# Patient Record
Sex: Male | Born: 1945 | Race: White | Hispanic: No | Smoking: Former smoker
Health system: Southern US, Community
[De-identification: ages and names within clinical notes are randomized; demographics above are authoritative.]

## PROBLEM LIST (undated history)

## (undated) DIAGNOSIS — F1010 Alcohol abuse, uncomplicated: Secondary | ICD-10-CM

## (undated) DIAGNOSIS — Z720 Tobacco use: Secondary | ICD-10-CM

---

## 1997-08-28 ENCOUNTER — Inpatient Hospital Stay (HOSPITAL_COMMUNITY): Admission: RE | Admit: 1997-08-28 | Discharge: 1997-08-29 | Payer: Self-pay | Admitting: Neurosurgery

## 2009-06-07 ENCOUNTER — Emergency Department: Payer: Self-pay | Admitting: Emergency Medicine

## 2017-02-05 DIAGNOSIS — H269 Unspecified cataract: Secondary | ICD-10-CM | POA: Diagnosis not present

## 2017-02-05 DIAGNOSIS — J449 Chronic obstructive pulmonary disease, unspecified: Secondary | ICD-10-CM | POA: Diagnosis not present

## 2017-04-01 DIAGNOSIS — H524 Presbyopia: Secondary | ICD-10-CM | POA: Diagnosis not present

## 2017-04-01 DIAGNOSIS — Z0100 Encounter for examination of eyes and vision without abnormal findings: Secondary | ICD-10-CM | POA: Diagnosis not present

## 2018-05-05 ENCOUNTER — Emergency Department
Admission: EM | Admit: 2018-05-05 | Discharge: 2018-05-05 | Disposition: A | Discharge status: Home / Self Care | Payer: Medicare Other | Attending: Emergency Medicine | Admitting: Emergency Medicine

## 2018-05-05 ENCOUNTER — Other Ambulatory Visit: Payer: Self-pay

## 2018-05-05 ENCOUNTER — Encounter: Payer: Self-pay | Admitting: *Deleted

## 2018-05-05 ENCOUNTER — Emergency Department: Payer: Medicare Other

## 2018-05-05 DIAGNOSIS — M25552 Pain in left hip: Secondary | ICD-10-CM | POA: Diagnosis present

## 2018-05-05 DIAGNOSIS — M25559 Pain in unspecified hip: Secondary | ICD-10-CM

## 2018-05-05 DIAGNOSIS — F17200 Nicotine dependence, unspecified, uncomplicated: Secondary | ICD-10-CM | POA: Diagnosis not present

## 2018-05-05 DIAGNOSIS — M5432 Sciatica, left side: Secondary | ICD-10-CM | POA: Insufficient documentation

## 2018-05-05 MED ORDER — PREDNISONE 10 MG PO TABS: 10.0000 mg | ORAL_TABLET | Freq: Every day | ORAL | 0 refills | Status: DC

## 2018-05-05 MED ORDER — TRAMADOL HCL 50 MG PO TABS: 50.0000 mg | ORAL_TABLET | Freq: Four times a day (QID) | ORAL | 0 refills | Status: DC | PRN

## 2018-05-05 MED ORDER — HYDROCODONE-ACETAMINOPHEN 5-325 MG PO TABS: 1.0000 | ORAL_TABLET | ORAL | 0 refills | Status: DC | PRN

## 2018-05-05 MED ORDER — PREDNISONE 20 MG PO TABS
60.0000 mg | ORAL_TABLET | Freq: Once | ORAL | Status: AC
  Administered 2018-05-05: 60 mg via ORAL
  Filled 2018-05-05: qty 3

## 2018-05-05 NOTE — ED Provider Notes (Signed)
[facility] Emergency Department Provider Note  Time seen: 6:42 PM  I have reviewed the triage vital signs and the nursing notes.   HISTORY  Chief Complaint Hip Pain   HPI Peter Galvan is a 73 y.o. male presents to the emergency department for left hip pain.  According to the patient over the past 3 to 4 days he has had progressive worsening of pain in the left hip.  States while walking he will feel pain in the left hip and left lower back, states occasionally pain shooting down the leg and occasional numbness sensation in the leg.  Patient states a history of chronic back pain, states he has had pain shooting down his right leg previously with a bulging disc but never his left leg.  Denies any falls or trauma.  No fever.    No past medical history on file.  There are no active problems to display for this patient.    Prior to Admission medications   Not on File    Allergies  Allergen Reactions  . Penicillins     No family history on file.  Social History Social History   Tobacco Use  . Smoking status: Current Every Day Smoker  . Smokeless tobacco: Never Used  Substance Use Topics  . Alcohol use: Not Currently  . Drug use: Not Currently    Review of Systems Constitutional: Negative for fever. Cardiovascular: Negative for chest pain. Respiratory: Negative for shortness of breath. Gastrointestinal: Negative for abdominal pain Musculoskeletal: Left lower back pain/left hip pain occasional shooting pain down the leg. Skin: Negative for skin complaints  Neurological: Negative for headache All other ROS negative  ____________________________________________   PHYSICAL EXAM:  VITAL SIGNS: ED Triage Vitals [05/05/18 1752]  Enc Vitals Group     BP (!) 153/83     Pulse Rate 100     Resp 20     Temp 98 F (36.7 C)     Temp Source Oral     SpO2 98 %     Weight 140 lb (63.5 kg)     Height 5\' 9"  (1.753 m)     Head Circumference       Peak Flow      Pain Score 4     Pain Loc      Pain Edu?      Excl. in GC?     Constitutional: Alert and oriented. Well appearing and in no distress. Eyes: Normal exam ENT   Head: Normocephalic and atraumatic   Mouth/Throat: Mucous membranes are moist. Cardiovascular: Normal rate, regular rhythm.  Respiratory: Normal respiratory effort without tachypnea nor retractions. Breath sounds are clear  Gastrointestinal: Soft and nontender. No distention.   Musculoskeletal: No significant left hip tenderness palpation, no midline lower back tenderness, no SI joint tenderness. Neurologic:  Normal speech and language. No gross focal neurologic deficits  Skin:  Skin is warm, dry and intact.  Psychiatric: Mood and affect are normal.   ____________________________________________   RADIOLOGY  X-rays show negative hip x-ray. Degenerative changes of the lumbar spine mild retrolisthesis at L2/L3, and L3/L4.  ____________________________________________   INITIAL IMPRESSION / ASSESSMENT AND PLAN / ED COURSE  Pertinent labs & imaging results that were available during my care of the patient were reviewed by me and considered in my medical decision making (see chart for details).  Patient presents emergency department for left lower back/left hip discomfort.  Describes a shooting pain down the left leg when he walks  longer distances and occasionally feels like the left leg has become heavy or numb.  Patient denies any decrease in sensation currently, has good sensation, no incontinence issues.  Has good range of motion and good strength currently.  Highly suspect sciatica.  We will obtain x-rays as a precaution.  We will likely discharge on steroids and pain medication.  Patient's x-rays are largely nonrevealing, exam most consistent with sciatica.  We will discharge on a taper of steroids as well as a short course of pain medication.  Patient will follow-up with orthopedics.  I discussed  return precautions for any worsening pain, numbness of the leg/weakness of the leg or incontinence.  ____________________________________________   FINAL CLINICAL IMPRESSION(S) / ED DIAGNOSES  Left hip pain Sciatica   Minna Antis, MD 05/05/18 1934

## 2018-05-05 NOTE — ED Notes (Signed)
Pt c/o pain to L hip socket x 3 days. Pt also c/o lower back pain x 3 days as well. Pt states "when I walk my left leg goes numb". Pt states in the morning he wakes up with lower back pain, has to walk 10-15 mins before he can "straighten up" then sit for 10 mins before the pain subsides. Pt with cap refill < 3 seconds, +movement, +sensation. Pt states he believes that he has a bulging disk to his lower back at this time.

## 2018-05-05 NOTE — ED Notes (Signed)
Reviewed discharge instructions, follow-up care, and prescriptions with patient. Patient verbalized understanding of all information reviewed. Patient stable, with no distress noted at this time.    

## 2018-05-05 NOTE — ED Triage Notes (Signed)
Pt ambulatory to triage.  Pt has left hip pain.  No known injury pain for 3 days.  Pt also reports lower back pain.  Pt alert  Speech clear

## 2018-05-18 ENCOUNTER — Observation Stay
Admission: EM | Admit: 2018-05-18 | Discharge: 2018-05-19 | Disposition: A | Discharge status: Home / Self Care | Payer: Medicare Other | Attending: Internal Medicine | Admitting: Internal Medicine

## 2018-05-18 ENCOUNTER — Observation Stay (HOSPITAL_BASED_OUTPATIENT_CLINIC_OR_DEPARTMENT_OTHER)
Admit: 2018-05-18 | Discharge: 2018-05-18 | Disposition: A | Discharge status: Home / Self Care | Payer: Medicare Other | Attending: Internal Medicine | Admitting: Internal Medicine

## 2018-05-18 ENCOUNTER — Emergency Department: Payer: Medicare Other

## 2018-05-18 ENCOUNTER — Other Ambulatory Visit: Payer: Self-pay

## 2018-05-18 DIAGNOSIS — R079 Chest pain, unspecified: Secondary | ICD-10-CM

## 2018-05-18 DIAGNOSIS — Z716 Tobacco abuse counseling: Secondary | ICD-10-CM | POA: Diagnosis not present

## 2018-05-18 DIAGNOSIS — F17210 Nicotine dependence, cigarettes, uncomplicated: Secondary | ICD-10-CM | POA: Insufficient documentation

## 2018-05-18 DIAGNOSIS — Z885 Allergy status to narcotic agent status: Secondary | ICD-10-CM | POA: Insufficient documentation

## 2018-05-18 DIAGNOSIS — F1010 Alcohol abuse, uncomplicated: Secondary | ICD-10-CM | POA: Diagnosis not present

## 2018-05-18 DIAGNOSIS — R072 Precordial pain: Principal | ICD-10-CM | POA: Insufficient documentation

## 2018-05-18 DIAGNOSIS — Z880 Allergy status to penicillin: Secondary | ICD-10-CM | POA: Diagnosis not present

## 2018-05-18 DIAGNOSIS — R42 Dizziness and giddiness: Secondary | ICD-10-CM | POA: Insufficient documentation

## 2018-05-18 DIAGNOSIS — R739 Hyperglycemia, unspecified: Secondary | ICD-10-CM | POA: Insufficient documentation

## 2018-05-18 DIAGNOSIS — F1210 Cannabis abuse, uncomplicated: Secondary | ICD-10-CM | POA: Insufficient documentation

## 2018-05-18 DIAGNOSIS — D72829 Elevated white blood cell count, unspecified: Secondary | ICD-10-CM | POA: Insufficient documentation

## 2018-05-18 DIAGNOSIS — F17200 Nicotine dependence, unspecified, uncomplicated: Secondary | ICD-10-CM | POA: Diagnosis not present

## 2018-05-18 DIAGNOSIS — E785 Hyperlipidemia, unspecified: Secondary | ICD-10-CM | POA: Diagnosis not present

## 2018-05-18 HISTORY — DX: Tobacco use: Z72.0

## 2018-05-18 HISTORY — DX: Alcohol abuse, uncomplicated: F10.10

## 2018-05-18 LAB — TROPONIN I
Troponin I: <0.03 ng/mL (ref <0.03)
Troponin I: <0.03 ng/mL (ref <0.03)
Troponin I: <0.03 ng/mL (ref <0.03)
Troponin I: <0.03 ng/mL (ref <0.03)

## 2018-05-18 LAB — CBC WITH DIFFERENTIAL/PLATELET
Abs Immature Granulocytes: 0.19 10*3/uL — ABNORMAL HIGH (ref 0.00–0.07)
Basophils Absolute: 0.1 10*3/uL (ref 0.0–0.1)
Basophils Relative: 0 %
Eosinophils Absolute: 0.1 10*3/uL (ref 0.0–0.5)
Eosinophils Relative: 1 %
HCT: 43.2 % (ref 39.0–52.0)
Hemoglobin: 14.5 g/dL (ref 13.0–17.0)
Immature Granulocytes: 1 %
Lymphocytes Relative: 14 %
Lymphs Abs: 2.5 10*3/uL (ref 0.7–4.0)
MCH: 31.6 pg (ref 26.0–34.0)
MCHC: 33.6 g/dL (ref 30.0–36.0)
MCV: 94.1 fL (ref 80.0–100.0)
Monocytes Absolute: 1.2 10*3/uL — ABNORMAL HIGH (ref 0.1–1.0)
Monocytes Relative: 6 %
Neutro Abs: 14 10*3/uL — ABNORMAL HIGH (ref 1.7–7.7)
Neutrophils Relative %: 78 %
Platelets: 222 10*3/uL (ref 150–400)
RBC: 4.59 MIL/uL (ref 4.22–5.81)
RDW: 13.9 % (ref 11.5–15.5)
WBC: 18 10*3/uL — ABNORMAL HIGH (ref 4.0–10.5)
nRBC: 0 % (ref 0.0–0.2)

## 2018-05-18 LAB — URINALYSIS, COMPLETE (UACMP) WITH MICROSCOPIC
Bilirubin Urine: NEGATIVE
Glucose, UA: NEGATIVE mg/dL
Hgb urine dipstick: NEGATIVE
Ketones, ur: NEGATIVE mg/dL
Leukocytes,Ua: NEGATIVE
Nitrite: NEGATIVE
Protein, ur: NEGATIVE mg/dL
Specific Gravity, Urine: 1.013 (ref 1.005–1.030)
Squamous Epithelial / LPF: NONE SEEN (ref 0–5)
pH: 7 (ref 5.0–8.0)

## 2018-05-18 LAB — BASIC METABOLIC PANEL
Anion gap: 8 (ref 5–15)
BUN: 30 mg/dL — ABNORMAL HIGH (ref 8–23)
CO2: 26 mmol/L (ref 22–32)
Calcium: 8.9 mg/dL (ref 8.9–10.3)
Chloride: 102 mmol/L (ref 98–111)
Creatinine, Ser: 1.21 mg/dL (ref 0.61–1.24)
GFR calc Af Amer: >60 mL/min (ref >60)
GFR calc non Af Amer: >60 mL/min (ref >60)
Glucose, Bld: 111 mg/dL — ABNORMAL HIGH (ref 70–99)
Potassium: 4.2 mmol/L (ref 3.5–5.1)
Sodium: 136 mmol/L (ref 135–145)

## 2018-05-18 LAB — CBC
HCT: 43 % (ref 39.0–52.0)
Hemoglobin: 14.3 g/dL (ref 13.0–17.0)
MCH: 31 pg (ref 26.0–34.0)
MCHC: 33.3 g/dL (ref 30.0–36.0)
MCV: 93.3 fL (ref 80.0–100.0)
Platelets: 254 10*3/uL (ref 150–400)
RBC: 4.61 MIL/uL (ref 4.22–5.81)
RDW: 14 % (ref 11.5–15.5)
WBC: 18.4 10*3/uL — ABNORMAL HIGH (ref 4.0–10.5)
nRBC: 0 % (ref 0.0–0.2)

## 2018-05-18 LAB — ECHOCARDIOGRAM COMPLETE
Height: 69 in
Weight: 2222.24 oz

## 2018-05-18 LAB — PROCALCITONIN: Procalcitonin: <0.1 ng/mL

## 2018-05-18 LAB — HEPATIC FUNCTION PANEL
ALT: 24 U/L (ref 0–44)
AST: 16 U/L (ref 15–41)
Albumin: 3.4 g/dL — ABNORMAL LOW (ref 3.5–5.0)
Alkaline Phosphatase: 63 U/L (ref 38–126)
Bilirubin, Direct: <0.1 mg/dL (ref 0.0–0.2)
Total Bilirubin: 0.5 mg/dL (ref 0.3–1.2)
Total Protein: 6.7 g/dL (ref 6.5–8.1)

## 2018-05-18 LAB — MAGNESIUM: Magnesium: 2.2 mg/dL (ref 1.7–2.4)

## 2018-05-18 MED ORDER — ATORVASTATIN CALCIUM 20 MG PO TABS
40.0000 mg | ORAL_TABLET | Freq: Every day | ORAL | Status: DC
  Administered 2018-05-18: 40 mg via ORAL
  Filled 2018-05-18: qty 2

## 2018-05-18 MED ORDER — NICOTINE 21 MG/24HR TD PT24
21.0000 mg | MEDICATED_PATCH | Freq: Every day | TRANSDERMAL | Status: DC | PRN
  Administered 2018-05-18: 21 mg via TRANSDERMAL
  Filled 2018-05-18 (×2): qty 1

## 2018-05-18 MED ORDER — ALUM & MAG HYDROXIDE-SIMETH 200-200-20 MG/5ML PO SUSP: 30.0000 mL | Freq: Four times a day (QID) | ORAL | Status: DC | PRN

## 2018-05-18 MED ORDER — ONDANSETRON HCL 4 MG/2ML IJ SOLN: 4.0000 mg | Freq: Four times a day (QID) | INTRAMUSCULAR | Status: DC | PRN

## 2018-05-18 MED ORDER — LIDOCAINE VISCOUS HCL 2 % MT SOLN
15.0000 mL | Freq: Four times a day (QID) | OROMUCOSAL | Status: DC | PRN
  Filled 2018-05-18: qty 15

## 2018-05-18 MED ORDER — MORPHINE SULFATE (PF) 2 MG/ML IV SOLN: 2.0000 mg | INTRAVENOUS | Status: DC | PRN

## 2018-05-18 MED ORDER — NITROGLYCERIN 2 % TD OINT
1.0000 [in_us] | TOPICAL_OINTMENT | Freq: Once | TRANSDERMAL | Status: AC
  Administered 2018-05-18: 13:00:00 1 [in_us] via TOPICAL
  Filled 2018-05-18: qty 1

## 2018-05-18 MED ORDER — ENOXAPARIN SODIUM 40 MG/0.4ML ~~LOC~~ SOLN
40.0000 mg | SUBCUTANEOUS | Status: DC
  Administered 2018-05-18: 40 mg via SUBCUTANEOUS
  Filled 2018-05-18: qty 0.4

## 2018-05-18 MED ORDER — ASPIRIN EC 81 MG PO TBEC
81.0000 mg | DELAYED_RELEASE_TABLET | Freq: Every day | ORAL | Status: DC
  Administered 2018-05-19: 81 mg via ORAL
  Filled 2018-05-18: qty 1

## 2018-05-18 MED ORDER — ACETAMINOPHEN 325 MG PO TABS: 650.0000 mg | ORAL_TABLET | ORAL | Status: DC | PRN

## 2018-05-18 MED ORDER — METOPROLOL TARTRATE 25 MG PO TABS
25.0000 mg | ORAL_TABLET | Freq: Two times a day (BID) | ORAL | Status: DC
  Administered 2018-05-18 – 2018-05-19 (×2): 25 mg via ORAL
  Filled 2018-05-18 (×2): qty 1

## 2018-05-18 MED ORDER — ASPIRIN 325 MG PO TABS
325.0000 mg | ORAL_TABLET | Freq: Once | ORAL | Status: DC
  Filled 2018-05-18: qty 1

## 2018-05-18 MED ORDER — SODIUM CHLORIDE 0.9% FLUSH
10.0000 mL | Freq: Two times a day (BID) | INTRAVENOUS | Status: DC
  Administered 2018-05-18: 10 mL via INTRAVENOUS

## 2018-05-18 NOTE — Progress Notes (Signed)
*  PRELIMINARY RESULTS* Echocardiogram 2D Echocardiogram has been performed.  Joanette Gula England Greb 05/18/2018, 4:32 PM

## 2018-05-18 NOTE — Consult Note (Signed)
Cardiology Consultation:   Patient ID: DEQUAVIUS KUHNER; [id number]; October 19, 1945   Admit date: 05/18/2018 Date of Consult: 05/18/2018  Primary Care Provider: Patient, No Pcp Per Primary Cardiologist: New to [facility] - consult by Gollan   Patient Profile:   Peter Galvan is a 73 y.o. 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 Nancy Marus.  History of Present Illness:   Peter Galvan has not seen a primary care physician for a routine checkup in at least 20 years.  He states "my PCP died 15 years ago."  Patient lives alone following the death of his son 3 years prior.  He indicates his son was born with 50 heart defects and underwent heart transplant 15 years prior at [facility]  Since then, the patient mostly enjoys fishing on a weekly basis with his brother.  Patient was in his usual state of health sitting on his swing in his backyard this morning when he developed sudden onset of substernal chest pressure rated a 7-8 out of 10 and lasted for approximately 3 to 4 minutes.  Patient stood up to walk inside and developed dizziness/presyncope and diaphoresis.  He made it to his kitchen table and called his neighbor who then contacted EMS.  All together, the patient indicates his symptoms lasted for a total of 10 to 15 minutes followed by spontaneous resolution.  Since then, he has been symptom-free.  He has denied any symptoms similar to this in the past.  He indicates at baseline his functional status is somewhat low and has attributed this to pain along his left hip.  More recently, the patient was seen in the [facility] ED on 05/05/2018 for left hip pain with associated sciatica and prescribed prednisone which has helped his symptoms.  He continues to smoke 1 pack daily and has done so for approximately 50 years.  He also  indicates smoking the occasional joint though denies any other illegal drugs.  He has a prior history of significant alcohol abuse though has not drank in 15 years.  He denies any lower extremity swelling, orthopnea, PND, abdominal distention, or early satiety.  Upon the patient's arrival to [facility] they were found to have stable vitals and be afebrile. EKG showed NSR, 70 bpm, normal axis, inferolateral Q waves, LVH with early repolarization abnormality, CXR showed no active cardiopulmonary process. Labs showed troponin negative x1, AST/ALT normal, CBC 18.4, Hgb 14.3, PLT 254, potassium 4.2, serum creatinine 1.21.  In the ED, aspirin 324 has been ordered and the patient has received sublingual nitroglycerin.  Upon admission, cardiology was consulted.  Currently, symptom-free and wants to go bass fishing.  Past Medical History:  Diagnosis Date   Alcohol abuse    a. reported seizure-like activity    Tobacco abuse     History reviewed. No pertinent surgical history.   Home Meds: Prior to Admission medications   Not on File    Inpatient Medications: Scheduled Meds:  [START ON 05/19/2018] aspirin EC  81 mg Oral Daily   atorvastatin  40 mg Oral q1800   enoxaparin (LOVENOX) injection  40 mg Subcutaneous Q24H   metoprolol tartrate  25 mg Oral BID   Continuous Infusions:  PRN Meds:   Allergies:   Allergies  Allergen Reactions   Codeine Itching and Nausea And Vomiting  Penicillins Hives    Did it involve swelling of the face/tongue/throat, SOB, or low BP? No Did it involve sudden or severe rash/hives, skin peeling, or any reaction on the inside of your mouth or nose? No Did you need to seek medical attention at a hospital or doctor's office? No When did it last happen?childhood If all above answers are NO, may proceed with cephalosporin use.    Social History:   Social History   Socioeconomic History   Marital status: Married    Spouse name: Not on file   Number  of children: Not on file   Years of education: Not on file   Highest education level: Not on file  Occupational History   Not on file  Social Needs   Financial resource strain: Not on file   Food insecurity:    Worry: Not on file    Inability: Not on file   Transportation needs:    Medical: Not on file    Non-medical: Not on file  Tobacco Use   Smoking status: Current Every Day Smoker    Packs/day: 1.00    Years: 50.00    Pack years: 50.00    Types: Cigarettes   Smokeless tobacco: Never Used  Substance and Sexual Activity   Alcohol use: Not Currently    Comment: a.  Previously reports being a heavy drinker though has not drank in 15 years   Drug use: Yes    Types: Marijuana   Sexual activity: Not on file  Lifestyle   Physical activity:    Days per week: Not on file    Minutes per session: Not on file   Stress: Not on file  Relationships   Social connections:    Talks on phone: Not on file    Gets together: Not on file    Attends religious service: Not on file    Active member of club or organization: Not on file    Attends meetings of clubs or organizations: Not on file    Relationship status: Not on file   Intimate partner violence:    Fear of current or ex partner: Not on file    Emotionally abused: Not on file    Physically abused: Not on file    Forced sexual activity: Not on file  Other Topics Concern   Not on file  Social History Narrative   Not on file     Family History:   Family History  Problem Relation Age of Onset   Hypertension Father    Heart disease Son        a. s/p heart transplant     ROS:  Review of Systems  Constitutional: Positive for diaphoresis and malaise/fatigue. Negative for chills, fever and weight loss.  HENT: Negative for congestion.   Eyes: Negative for discharge and redness.  Respiratory: Positive for shortness of breath. Negative for cough, hemoptysis, sputum production and wheezing.   Cardiovascular:  Positive for chest pain. Negative for palpitations, orthopnea, claudication, leg swelling and PND.  Gastrointestinal: Negative for abdominal pain, blood in stool, heartburn, melena, nausea and vomiting.  Genitourinary: Negative for hematuria.  Musculoskeletal: Negative for falls and myalgias.  Skin: Negative for rash.  Neurological: Positive for dizziness and weakness. Negative for tingling, tremors, sensory change, speech change, focal weakness and loss of consciousness.  Endo/Heme/Allergies: Does not bruise/bleed easily.  Psychiatric/Behavioral: Negative for substance abuse. The patient is not nervous/anxious.   All other systems reviewed and are negative.  Physical Exam/Data:   Vitals:   05/18/18 1330 05/18/18 1345 05/18/18 1400 05/18/18 1446  BP: 126/70 132/71 133/76 (!) 158/79  Pulse: 74 73 73 84  Resp: Temp:    98.6 F (37 C)  TempSrc:    Oral  SpO2: 100% 100% 100% 100%  Weight:      Height:       No intake or output data in the 24 hours ending 05/18/18 1524 Filed Weights   05/18/18 1232  Weight: 63 kg   Body mass index is 20.51 kg/m.   Physical Exam: General: Well developed, well nourished, in no acute distress. Head: Normocephalic, atraumatic, sclera non-icteric, no xanthomas, nares without discharge.  Neck: Negative for carotid bruits. JVD not elevated. Lungs: Clear bilaterally to auscultation without wheezes, rales, or rhonchi. Breathing is unlabored. Heart: RRR with S1 S2. No murmurs, rubs, or gallops appreciated. Abdomen: Soft, non-tender, non-distended with normoactive bowel sounds. No hepatomegaly. No rebound/guarding. No obvious abdominal masses. Msk:  Strength and tone appear normal for age. Extremities: No clubbing or cyanosis. No edema. Distal pedal pulses are 2+ and equal bilaterally. Neuro: Alert and oriented X 3. No facial asymmetry. No focal deficit. Moves all extremities spontaneously. Psych:  Responds to questions appropriately with a  normal affect.   EKG:  The EKG was personally reviewed and demonstrates: NSR, 70 bpm, normal axis, inferolateral Q waves, LVH with early repolarization abnormality Telemetry:  Telemetry was personally reviewed and demonstrates: Not available for review at time of consult  Weights: [facility] Weights   05/18/18 1232  Weight: 63 kg    Relevant CV Studies: None   Laboratory Data:  Chemistry Recent Labs  Lab 05/18/18 1241  NA 136  K 4.2  CL 102  CO2 26  GLUCOSE 111*  BUN 30*  CREATININE 1.21  CALCIUM 8.9  GFRNONAA >60  GFRAA >60  ANIONGAP 8    Recent Labs  Lab 05/18/18 1241  PROT 6.7  ALBUMIN 3.4*  AST 16  ALT 24  ALKPHOS 63  BILITOT 0.5   Hematology Recent Labs  Lab 05/18/18 1241  WBC 18.4*  RBC 4.61  HGB 14.3  HCT 43.0  MCV 93.3  MCH 31.0  MCHC 33.3  RDW 14.0  PLT 254   Cardiac Enzymes Recent Labs  Lab 05/18/18 1241  TROPONINI <0.03   No results for input(s): TROPIPOC in the last 168 hours.  BNPNo results for input(s): BNP, PROBNP in the last 168 hours.  DDimer No results for input(s): DDIMER in the last 168 hours.  Radiology/Studies:  Dg Chest 2 View  Result Date: 05/18/2018 IMPRESSION: No active cardiopulmonary disease. Electronically Signed   By: Lupita Raider M.D.   On: 05/18/2018 13:16    Assessment and Plan:   1.  Chest pain with moderate risk for cardiac etiology: -Presentation of symptoms at rest is atypical.  However, the patient does have significant risk factors for CAD including ongoing tobacco abuse with a 50-pack-year history, male sex with age greater than 39 years of age and his associated symptoms are concerning -He has not been evaluated by a PCP in at least 20 years -Currently, symptom-free -Initial troponin negative, continue to cycle for rule out -Echo pending -ASA -Check fasting lipid panel and A1c for further risk stratification -If he rules out, would hold off on starting heparin drip and plan for Lexiscan Myoview on  4/23 to evaluate for high risk ischemia -If patient demonstrates significant rise in troponin or has  symptoms concerning for unstable angina he would benefit from initiation of heparin infusion with consideration for urgent cardiac cath  2.  Hyperglycemia: -A1c pending  3.  Ongoing tobacco abuse: -Nicotine patch -Complete cessation is advised  4.  Leukocytosis: -No obvious source of infection -Possibly inflammatory in the setting of #1 -Trend  5.  Upper extremity cramping: -Potassium at goal -Check magnesium   For questions or updates, please contact [facility] Please consult [email or web address] for contact info under Cardiology/STEMI.   Signed, Eula Listenyan Camera Krienke, PA-C [facility] Pager: [phone] 05/18/2018, 3:24 PM

## 2018-05-18 NOTE — ED Triage Notes (Signed)
Pt comes from home via EMS after having some tightness in the center of his chest for about 5 mins with some SOB. EKG normal per EMS. VSS. Pt reports CP has subsided since.

## 2018-05-18 NOTE — ED Notes (Signed)
ED TO INPATIENT HANDOFF REPORT  ED Nurse Name and Phone #: Shanda Bumps [phone]  S Name/Age/Gender Peter Galvan 73 y.o. male Room/Bed: ED14A/ED14A  Code Status   Code Status: Not on file  Home/SNF/Other Home Patient oriented to: self, place, time and situation Is this baseline? Yes   Triage Complete: Triage complete  Chief Complaint cp  Triage Note Pt comes from home via EMS after having some tightness in the center of his chest for about 5 mins with some SOB. EKG normal per EMS. VSS. Pt reports CP has subsided since.    Allergies Allergies  Allergen Reactions  . Codeine Itching and Nausea And Vomiting  . Penicillins Hives    Did it involve swelling of the face/tongue/throat, SOB, or low BP? No Did it involve sudden or severe rash/hives, skin peeling, or any reaction on the inside of your mouth or nose? No Did you need to seek medical attention at a hospital or doctor's office? No When did it last happen?childhood If all above answers are "NO", may proceed with cephalosporin use.    Level of Care/Admitting Diagnosis ED Disposition    ED Disposition Condition Comment   Admit  Hospital Area: [facility] [[id number]]  Level of Care: Telemetry [5]  Covid Evaluation: N/A  Diagnosis: Chest pain [299371]  Admitting Physician: Willadean Carol DODD [[id number]]  Attending Physician: Willadean Carol DODD [[id number]]  PT Class (Do Not Modify): Observation [104]  PT Acc Code (Do Not Modify): Observation [10022]       B Medical/Surgery History History reviewed. No pertinent past medical history. History reviewed. No pertinent surgical history.   A IV Location/Drains/Wounds Patient Lines/Drains/Airways Status   Active Line/Drains/Airways    Name:   Placement date:   Placement time:   Site:   Days:   Peripheral IV 05/18/18 Right Antecubital   05/18/18    1240    Antecubital   less than 1          Intake/Output Last 24 hours No intake or output data in the  24 hours ending 05/18/18 1419  Labs/Imaging Results for orders placed or performed during the hospital encounter of 05/18/18 (from the past 48 hour(s))  Basic metabolic panel     Status: Abnormal   Collection Time: 05/18/18 12:41 PM  Result Value Ref Range   Sodium 136 135 - 145 mmol/L   Potassium 4.2 3.5 - 5.1 mmol/L   Chloride 102 98 - 111 mmol/L   CO2 26 22 - 32 mmol/L   Glucose, Bld 111 (H) 70 - 99 mg/dL   BUN 30 (H) 8 - 23 mg/dL   Creatinine, Ser 0.25 0.61 - 1.24 mg/dL   Calcium 8.9 8.9 - 85.2 mg/dL   GFR calc non Af Amer >60 >60 mL/min   GFR calc Af Amer >60 >60 mL/min   Anion gap 8 5 - 15    Comment: Performed at [facility], [address] [place], [place] [address]  CBC     Status: Abnormal   Collection Time: 05/18/18 12:41 PM  Result Value Ref Range   WBC 18.4 (H) 4.0 - 10.5 K/uL   RBC 4.61 4.22 - 5.81 MIL/uL   Hemoglobin 14.3 13.0 - 17.0 g/dL   HCT 23.5 36.1 - 44.3 %   MCV 93.3 80.0 - 100.0 fL   MCH 31.0 26.0 - 34.0 pg   MCHC 33.3 30.0 - 36.0 g/dL   RDW 15.4 00.8 - 67.6 %   Platelets 254 150 -  400 K/uL   nRBC 0.0 0.0 - 0.2 %    Comment: Performed at [facility], [address] [place], [place] [address]  Troponin I - ONCE - STAT     Status: None   Collection Time: 05/18/18 12:41 PM  Result Value Ref Range   Troponin I <0.03 <0.03 ng/mL    Comment: Performed at [facility], [address] [place], [place] [address]  Hepatic function panel     Status: Abnormal   Collection Time: 05/18/18 12:41 PM  Result Value Ref Range   Total Protein 6.7 6.5 - 8.1 g/dL   Albumin 3.4 (L) 3.5 - 5.0 g/dL   AST 16 15 - 41 U/L   ALT 24 0 - 44 U/L   Alkaline Phosphatase 63 38 - 126 U/L   Total Bilirubin 0.5 0.3 - 1.2 mg/dL   Bilirubin, Direct <1.9<0.1 0.0 - 0.2 mg/dL   Indirect Bilirubin NOT CALCULATED 0.3 - 0.9 mg/dL    Comment: Performed at [facility], [address] [place], [place] [address]   Dg Chest 2 View  Result Date:  05/18/2018 CLINICAL DATA:  Chest pain. EXAM: CHEST - 2 VIEW COMPARISON:  None. FINDINGS: The heart size and mediastinal contours are within normal limits. Both lungs are clear. No pneumothorax or pleural effusion is noted. The visualized skeletal structures are unremarkable. IMPRESSION: No active cardiopulmonary disease. Electronically Signed   By: Lupita RaiderJames  Green Jr M.D.   On: 05/18/2018 13:16    Pending Labs [employer] Labs (From admission, onward)    Start     Ordered   Signed and Held  HIV antibody (Routine Testing)  Once,   R     Signed and Held   Signed and Held  Troponin I - Now Then Q3H  Now then every 3 hours,   TIMED     Signed and Held   Signed and Held  Procalcitonin - Baseline  ONCE - STAT,   STAT     Signed and Held   Signed and Held  Urinalysis, Complete w Microscopic  Once,   R     Signed and Held   Signed and Held  Hemoglobin A1c  Tomorrow morning,   R     Signed and Held   Signed and Held  Lipid panel  Tomorrow morning,   R     Signed and Held          Vitals/Pain Today's Vitals   05/18/18 1316 05/18/18 1330 05/18/18 1345 05/18/18 1400  BP: 130/78 126/70 132/71 133/76  Pulse: 79 74 73 73  Resp: 16 20 12 10   Temp:      TempSrc:      SpO2: 100% 100% 100% 100%  Weight:      Height:      PainSc:        Isolation Precautions No active isolations  Medications Medications  aspirin tablet 325 mg (has no administration in time range)  nitroGLYCERIN (NITROGLYN) 2 % ointment 1 inch (1 inch Topical Given 05/18/18 1326)    Mobility walks Low fall risk   Focused Assessments Cardiac Assessment Handoff:  Cardiac Rhythm: Normal sinus rhythm Lab Results  Component Value Date   TROPONINI <0.03 05/18/2018   No results found for: DDIMER Does the Patient currently have chest pain? No     R Recommendations: See Admitting Provider Note  Report given to:   Additional Notes: 1in nitro paste on pt

## 2018-05-18 NOTE — ED Provider Notes (Signed)
[facility] Emergency Department Provider Note  ____________________________________________   First MD Initiated Contact with Patient 05/18/18 1239     (approximate)  I have reviewed the triage vital signs and the nursing notes.   HISTORY  Chief Complaint Chest Pain    HPI Peter Galvan is a 73 y.o. male presents emergency department complaining of midsternal chest pain that lasted for about 5 minutes prior to arrival.  He states he was sitting on a swing outside felt a chest pain once it passed he walked to the house and became diaphoretic.  He had no nausea or vomiting.  No pain at this time.  States the pain did not radiate anywhere.  Denies abdominal pain.  Denies shortness of breath.  No prior history of heart problems.  Patient is a smoker.  1 pack/day for over 50 years.    History reviewed. No pertinent past medical history.  There are no active problems to display for this patient.   History reviewed. No pertinent surgical history.  Prior to Admission medications   Not on File    Allergies Codeine and Penicillins  History reviewed. No pertinent family history.  Social History Social History   Tobacco Use  . Smoking status: Current Every Day Smoker  . Smokeless tobacco: Never Used  Substance Use Topics  . Alcohol use: Not Currently  . Drug use: Not Currently    Review of Systems  Constitutional: No fever/chills Eyes: No visual changes. ENT: No sore throat. Respiratory: Denies cough Cardiovascular: Positive chest pain Genitourinary: Negative for dysuria. Musculoskeletal: Negative for back pain. Skin: Negative for rash.    ____________________________________________   PHYSICAL EXAM:  VITAL SIGNS: ED Triage Vitals  Enc Vitals Group     BP 05/18/18 1234 137/82     Pulse Rate 05/18/18 1234 77     Resp 05/18/18 1234 16     Temp 05/18/18 1234 98.9 F (37.2 C)     Temp Source 05/18/18 1234 Oral     SpO2  05/18/18 1234 99 %     Weight 05/18/18 1232 138 lb 14.2 oz (63 kg)     Height 05/18/18 1232 5\' 9"  (1.753 m)     Head Circumference --      Peak Flow --      Pain Score 05/18/18 1232 0     Pain Loc --      Pain Edu? --      Excl. in GC? --     Constitutional: Alert and oriented. Well appearing and in no acute distress. Eyes: Conjunctivae are normal.  Head: Atraumatic. Nose: No congestion/rhinnorhea. Mouth/Throat: Mucous membranes are moist.   Neck:  supple no lymphadenopathy noted Cardiovascular: Normal rate, regular rhythm. Heart sounds are normal Respiratory: Normal respiratory effort.  No retractions, lungs c t a  Abd: soft nontender bs normal all 4 quad, no pulsatile masses noted GU: deferred Musculoskeletal: FROM all extremities, warm and well perfused Neurologic:  Normal speech and language.  Skin:  Skin is warm, dry and intact. No rash noted. Psychiatric: Mood and affect are normal. Speech and behavior are normal.  ____________________________________________   LABS (all labs ordered are listed, but only abnormal results are displayed)  Labs Reviewed  BASIC METABOLIC PANEL - Abnormal; Notable for the following components:      Result Value   Glucose, Bld 111 (*)    BUN 30 (*)    All other components within normal limits  CBC - Abnormal; Notable for the  following components:   WBC 18.4 (*)    All other components within normal limits  TROPONIN I  HEPATIC FUNCTION PANEL   ____________________________________________   ____________________________________________  RADIOLOGY  Chest x-ray is normal  ____________________________________________   PROCEDURES  Procedure(s) performed: EKG shows normal sinus rhythm  Procedures    ____________________________________________   INITIAL IMPRESSION / ASSESSMENT AND PLAN / ED COURSE  Pertinent labs & imaging results that were available during my care of the patient were reviewed by me and considered in my  medical decision making (see chart for details).   Patient 73 year old male presents emergency department with chest pain prior to arrival.  No chest pain at this time.  Physical exam shows that the patient appears well and talkative.  Heart sounds are normal, lungs are clear to auscultation, abdomen is nontender.  EKG shows normal sinus rhythm  Cardiac labs and chest x-ray ordered.  ddx: MI, pleuritic chest pain, pneumonia, angina, atypical chest pain    ----------------------------------------- 1:34 PM on 05/18/2018 -----------------------------------------  Dr. Mayford KnifeWilliams examined patient.  He agrees patient has nonspecific chest pain is to be admitted for observation.  Hospitalist was called and she agrees to admit patient.   As part of my medical decision making, I reviewed the following data within the electronic MEDICAL RECORD NUMBER Nursing notes reviewed and incorporated, Labs reviewed CBC shows elevated WBC of 18.4, troponin is normal, basic metabolic panel is normal, hepatic functions pending, EKG interpreted NSR, Old chart reviewed, Radiograph reviewed chest x-ray is normal, Discussed with admitting physician hospitalist, Evaluated by EM attending Dr. Mayford KnifeWilliams, Notes from prior ED visits and [place] Controlled Substance Database  ____________________________________________   FINAL CLINICAL IMPRESSION(S) / ED DIAGNOSES  Final diagnoses:  Nonspecific chest pain      NEW MEDICATIONS STARTED DURING THIS VISIT:  New Prescriptions   No medications on file     Note:  This document was prepared using Dragon voice recognition software and may include unintentional dictation errors.    Faythe GheeFisher, Laurice Kimmons W, PA-C 05/18/18 1336    Emily FilbertWilliams, Jonathan E, MD 05/18/18 [phone]

## 2018-05-18 NOTE — Progress Notes (Signed)
Family Meeting Note  Advance Directive:yes  Today a meeting took place with the Patient.  Patient is able to participate.  The following clinical team members were present during this meeting:MD  The following were discussed:Patient's diagnosis: chest pain, Patient's progosis: Unable to determine and Goals for treatment: Full Code   Discussed CODE STATUS in detail with patient.  He states he has had a lot of conversations about this in the past with his son, who had congenital heart disease and passed away 3 years ago.  Patient states that he is overall very healthy and has no medical conditions.  He states that he "still has a lot of bass fishing left to do".  Patient like to be full code at this time and pursue full aggressive medical care.  Additional follow-up to be provided: prn  Time spent during discussion:20 minutes  Hilton Sinclair, MD

## 2018-05-18 NOTE — H&P (Signed)
[facility] [place] [facility]   PATIENT NAME: Earleen NewportMichael Eyerman    MR#:  [id number]  DATE OF BIRTH:  10/12/1945  DATE OF ADMISSION:  05/18/2018  PRIMARY CARE PHYSICIAN: Patient, No Pcp Per   REQUESTING/REFERRING PHYSICIAN: Daryel NovemberJonathan Williams, MD  CHIEF COMPLAINT:   Chief Complaint  Patient presents with  . Chest Pain    HISTORY OF PRESENT ILLNESS:  Earleen NewportMichael Starrett  is a 73 y.o. male with no past medical history who presented to the ED with substernal chest pain that started this morning.  He was sitting in a swing in his backyard around 8:30 AM this morning, when he suddenly developed midsternal "chest pressure" that radiated up to his neck and to his mid back.  He had associated shortness of breath.  The pain lasted 3-4 minutes and then resolve on its own.  He then tried to walk back inside the house when he suddenly felt "lightheaded".  He then became very sweaty, so he called his neighbor, who called EMS.  He has no history of heart problems.  He does smoke 1 pack/day for 50 years.   In the ED, vitals were unremarkable.  Labs are significant for WBC 18.4.  Troponin was negative.  Chest x-ray was negative.  EKG is unremarkable.  Due to concern for unstable angina, hospitalists were called for admission.  PAST MEDICAL HISTORY:  None  PAST SURGICAL HISTORY:  None  SOCIAL HISTORY:   Social History   Tobacco Use  . Smoking status: Current Every Day Smoker  . Smokeless tobacco: Never Used  Substance Use Topics  . Alcohol use: Not Currently    FAMILY HISTORY:  Son-congenital heart disease  DRUG ALLERGIES:   Allergies  Allergen Reactions  . Codeine Itching and Nausea And Vomiting  . Penicillins Hives    Did it involve swelling of the face/tongue/throat, SOB, or low BP? No Did it involve sudden or severe rash/hives, skin peeling, or any reaction on the inside of your mouth or nose? No Did you need to seek medical attention at a hospital or  doctor's office? No When did it last happen?childhood If all above answers are "NO", may proceed with cephalosporin use.    REVIEW OF SYSTEMS:   Review of Systems  Constitutional: Negative for chills and fever.  HENT: Negative for congestion and sore throat.   Eyes: Negative for blurred vision and double vision.  Respiratory: Positive for shortness of breath. Negative for cough.   Cardiovascular: Positive for chest pain. Negative for palpitations and leg swelling.  Gastrointestinal: Negative for nausea and vomiting.  Genitourinary: Negative for dysuria and urgency.  Musculoskeletal: Negative for back pain and neck pain.  Neurological: Positive for dizziness. Negative for headaches.  Psychiatric/Behavioral: Negative for depression and suicidal ideas.    MEDICATIONS AT HOME:   Prior to Admission medications   Not on File      VITAL SIGNS:  Blood pressure 126/70, pulse 74, temperature 98.9 F (37.2 C), temperature source Oral, resp. rate 20, height 5\' 9"  (1.753 m), weight 63 kg, SpO2 100 %.  PHYSICAL EXAMINATION:  Physical Exam  GENERAL:  73 y.o.-year-old patient lying in the bed with no acute distress.  EYES: Pupils equal, round, reactive to light and accommodation. No scleral icterus. Extraocular muscles intact.  HEENT: Head atraumatic, normocephalic. Oropharynx and nasopharynx clear.  NECK:  Supple, no jugular venous distention. No thyroid enlargement, no tenderness.  LUNGS: Normal breath sounds bilaterally, no wheezing, rales,rhonchi or crepitation. No use  of accessory muscles of respiration.  CARDIOVASCULAR: RRR, S1, S2 normal. No murmurs, rubs, or gallops.  ABDOMEN: Soft, nontender, nondistended. Bowel sounds present. No organomegaly or mass.  EXTREMITIES: No pedal edema, cyanosis, or clubbing.  NEUROLOGIC: Cranial nerves II through XII are intact. Muscle strength 5/5 in all extremities. Sensation intact. Gait not checked.  PSYCHIATRIC: The patient is alert and  oriented x 3.  SKIN: No obvious rash, lesion, or ulcer.   LABORATORY PANEL:   CBC Recent Labs  Lab 05/18/18 1241  WBC 18.4*  HGB 14.3  HCT 43.0  PLT 254   ------------------------------------------------------------------------------------------------------------------  Chemistries  Recent Labs  Lab 05/18/18 1241  NA 136  K 4.2  CL 102  CO2 26  GLUCOSE 111*  BUN 30*  CREATININE 1.21  CALCIUM 8.9   ------------------------------------------------------------------------------------------------------------------  Cardiac Enzymes Recent Labs  Lab 05/18/18 1241  TROPONINI <0.03   ------------------------------------------------------------------------------------------------------------------  RADIOLOGY:  Dg Chest 2 View  Result Date: 05/18/2018 CLINICAL DATA:  Chest pain. EXAM: CHEST - 2 VIEW COMPARISON:  None. FINDINGS: The heart size and mediastinal contours are within normal limits. Both lungs are clear. No pneumothorax or pleural effusion is noted. The visualized skeletal structures are unremarkable. IMPRESSION: No active cardiopulmonary disease. Electronically Signed   By: Lupita Raider M.D.   On: 05/18/2018 13:16      IMPRESSION AND PLAN:   Chest pain- concern for cardiac etiology.  Patient has never had a cardiac work-up before.  Chest pain has resolved in the ED. BP normal, so think aortic dissection is less likely.  No tachycardia or hypoxia to suggest PE. -Cardiology consult- [facility] -Trend troponins -Give aspirin 325 mg now, then continue daily aspirin 81 mg -Start beta-blocker and statin -ECHO ordered -Check lipid panel and a1c  Leukocytosis- unclear etiology.  Patient denies any infectious symptoms.  No fevers.  Chest x-ray negative. -Check UA, procalcitonin, and differential -Repeat two-view CXR in the morning -Hold on antibiotics for now.  Hyperglycemia- no diagnosis of diabetes -Check a1c  Tobacco abuse- has smoked 1 pack/day for 50 years  -Nicotine patch as needed -Tobacco cessation counseling performed by me  All the records are reviewed and case discussed with ED provider. Management plans discussed with the patient, family and they are in agreement.  CODE STATUS: Full  TOTAL TIME TAKING CARE OF THIS PATIENT: 45 minutes.    Jinny Blossom Zhana Jeangilles M.D on 05/18/2018 at 1:44 PM  Between 7am to 6pm - Pager - [phone]  After 6pm go to [email or web address] - Social research officer, government  [facility]  Office  [phone]  CC: Primary care physician; Patient, No Pcp Per   Note: This dictation was prepared with Dragon dictation along with smaller phrase technology. Any transcriptional errors that result from this process are unintentional.

## 2018-05-18 NOTE — ED Provider Notes (Signed)
Patient seen and examined by me, had symptoms concerning for unstable angina.  Initial troponin is negative.  Patient likely will need admission with full cardiac work-up and possible stress testing.  We will discuss with the hospitalist for admission.   Emily Filbert, MD 05/18/18 1321

## 2018-05-19 ENCOUNTER — Observation Stay: Payer: Medicare Other

## 2018-05-19 ENCOUNTER — Observation Stay (HOSPITAL_BASED_OUTPATIENT_CLINIC_OR_DEPARTMENT_OTHER): Payer: Medicare Other

## 2018-05-19 DIAGNOSIS — R079 Chest pain, unspecified: Secondary | ICD-10-CM

## 2018-05-19 LAB — CBC
HCT: 39.4 % (ref 39.0–52.0)
Hemoglobin: 13 g/dL (ref 13.0–17.0)
MCH: 31.3 pg (ref 26.0–34.0)
MCHC: 33 g/dL (ref 30.0–36.0)
MCV: 94.7 fL (ref 80.0–100.0)
Platelets: 217 10*3/uL (ref 150–400)
RBC: 4.16 MIL/uL — ABNORMAL LOW (ref 4.22–5.81)
RDW: 14 % (ref 11.5–15.5)
WBC: 13.1 10*3/uL — ABNORMAL HIGH (ref 4.0–10.5)
nRBC: 0 % (ref 0.0–0.2)

## 2018-05-19 LAB — BASIC METABOLIC PANEL
Anion gap: 8 (ref 5–15)
BUN: 29 mg/dL — ABNORMAL HIGH (ref 8–23)
CO2: 25 mmol/L (ref 22–32)
Calcium: 8.6 mg/dL — ABNORMAL LOW (ref 8.9–10.3)
Chloride: 106 mmol/L (ref 98–111)
Creatinine, Ser: 1.06 mg/dL (ref 0.61–1.24)
GFR calc Af Amer: >60 mL/min (ref >60)
GFR calc non Af Amer: >60 mL/min (ref >60)
Glucose, Bld: 94 mg/dL (ref 70–99)
Potassium: 4.4 mmol/L (ref 3.5–5.1)
Sodium: 139 mmol/L (ref 135–145)

## 2018-05-19 LAB — LIPID PANEL
Cholesterol: 232 mg/dL — ABNORMAL HIGH (ref 0–200)
HDL: 49 mg/dL (ref >40)
LDL Cholesterol: 160 mg/dL — ABNORMAL HIGH (ref 0–99)
Total CHOL/HDL Ratio: 4.7 RATIO
Triglycerides: 114 mg/dL (ref <150)
VLDL: 23 mg/dL (ref 0–40)

## 2018-05-19 LAB — NM MYOCAR MULTI W/SPECT W/WALL MOTION / EF
LV dias vol: 48 mL (ref 62–150)
LV sys vol: 12 mL
Peak HR: 97 {beats}/min
Percent HR: 64 %
Rest HR: 66 {beats}/min
SDS: 3
SRS: 0
SSS: 0
TID: 1.08

## 2018-05-19 LAB — URINE CULTURE: Culture: NO GROWTH

## 2018-05-19 LAB — HEMOGLOBIN A1C
Hgb A1c MFr Bld: 5.4 % (ref 4.8–5.6)
Mean Plasma Glucose: 108.28 mg/dL

## 2018-05-19 LAB — HIV ANTIBODY (ROUTINE TESTING W REFLEX): HIV Screen 4th Generation wRfx: NONREACTIVE

## 2018-05-19 LAB — TROPONIN I: Troponin I: <0.03 ng/mL (ref <0.03)

## 2018-05-19 MED ORDER — REGADENOSON 0.4 MG/5ML IV SOLN
0.4000 mg | Freq: Once | INTRAVENOUS | Status: AC
  Administered 2018-05-19: 0.4 mg via INTRAVENOUS
  Filled 2018-05-19: qty 5

## 2018-05-19 MED ORDER — TECHNETIUM TC 99M TETROFOSMIN IV KIT
10.0000 | PACK | Freq: Once | INTRAVENOUS | Status: AC | PRN
  Administered 2018-05-19: 11.09 via INTRAVENOUS

## 2018-05-19 MED ORDER — TECHNETIUM TC 99M TETROFOSMIN IV KIT
32.3800 | PACK | Freq: Once | INTRAVENOUS | Status: AC | PRN
  Administered 2018-05-19: 32.38 via INTRAVENOUS

## 2018-05-19 MED ORDER — ATORVASTATIN CALCIUM 40 MG PO TABS: 40.0000 mg | ORAL_TABLET | Freq: Every day | ORAL | 11 refills | Status: DC

## 2018-05-19 NOTE — Plan of Care (Signed)
  Problem: Clinical Measurements: Goal: Diagnostic test results will improve Outcome: Progressing   Problem: Activity: Goal: Risk for activity intolerance will decrease Outcome: Progressing   Problem: Nutrition: Goal: Adequate nutrition will be maintained Outcome: Progressing   Problem: Coping: Goal: Level of anxiety will decrease Outcome: Progressing   Problem: Elimination: Goal: Will not experience complications related to bowel motility Outcome: Progressing   Problem: Education: Goal: Understanding of cardiac disease, CV risk reduction, and recovery process will improve Outcome: Progressing

## 2018-05-19 NOTE — Progress Notes (Addendum)
Progress Note  Patient Name: Peter Galvan Date of Encounter: 05/19/2018  [facility] Cardiologist[facility] consult by Mariah MillingGollan  Subjective   Has noted 2/10 chest pain all night that persists this morning. Ruled out. Echo with preserved EF and no WMA. Vitals stable.   Inpatient Medications    Scheduled Meds: . aspirin EC  81 mg Oral Daily  . atorvastatin  40 mg Oral q1800  . enoxaparin (LOVENOX) injection  40 mg Subcutaneous Q24H  . metoprolol tartrate  25 mg Oral BID  . sodium chloride flush  10 mL Intravenous Q12H   Continuous Infusions:  PRN Meds: acetaminophen, alum & mag hydroxide-simeth **AND** lidocaine, morphine injection, nicotine, ondansetron (ZOFRAN) IV   Vital Signs    Vitals:   05/18/18 1446 05/18/18 2011 05/19/18 0154 05/19/18 0445  BP: (!) 158/79 (!) 100/58 104/63 102/62  Pulse: 84 64 61 68  Resp: 16 18  18   Temp: 98.6 F (37 C) 98.6 F (37 C)  98.6 F (37 C)  TempSrc: Oral Oral  Oral  SpO2: 100% 97% 98% 97%  Weight:    61.4 kg  Height:        Intake/Output Summary (Last 24 hours) at 05/19/2018 0719 Last data filed at 05/19/2018 0448 Gross per 24 hour  Intake -  Output 1050 ml  Net -1050 ml   Filed Weights   05/18/18 1232 05/19/18 0445  Weight: 63 kg 61.4 kg    Telemetry    NSR - Personally Reviewed  ECG    n.a - Personally Reviewed  Physical Exam   GEN: No acute distress.   Neck: No JVD. Cardiac: RRR, no murmurs, rubs, or gallops.  Respiratory: Clear to auscultation bilaterally.  GI: Soft, nontender, non-distended.   MS: No edema; No deformity. Neuro:  Alert and oriented x 3; Nonfocal.  Psych: Normal affect.  Labs    Chemistry Recent Labs  Lab 05/18/18 1241 05/19/18 0431  NA 136 139  K 4.2 4.4  CL 102 106  CO2 26 25  GLUCOSE 111* 94  BUN 30* 29*  CREATININE 1.21 1.06  CALCIUM 8.9 8.6*  PROT 6.7  --   ALBUMIN 3.4*  --   AST 16  --   ALT 24  --   ALKPHOS 63  --   BILITOT 0.5  --   GFRNONAA >60 >60   GFRAA >60 >60  ANIONGAP 8 8     Hematology Recent Labs  Lab 05/18/18 1241 05/18/18 1509 05/19/18 0431  WBC 18.4* 18.0* 13.1*  RBC 4.61 4.59 4.16*  HGB 14.3 14.5 13.0  HCT 43.0 43.2 39.4  MCV 93.3 94.1 94.7  MCH 31.0 31.6 31.3  MCHC 33.3 33.6 33.0  RDW 14.0 13.9 14.0  PLT 254 222 217    Cardiac Enzymes Recent Labs  Lab 05/18/18 1241 05/18/18 1509 05/18/18 1813 05/18/18 2059  TROPONINI <0.03 <0.03 <0.03 <0.03   No results for input(s): TROPIPOC in the last 168 hours.   BNPNo results for input(s): BNP, PROBNP in the last 168 hours.   DDimer No results for input(s): DDIMER in the last 168 hours.   Radiology    Dg Chest 2 View  Result Date: 05/18/2018 IMPRESSION: No active cardiopulmonary disease. Electronically Signed   By: Lupita RaiderJames  Green Jr M.D.   On: 05/18/2018 13:16    Cardiac Studies   2D Echo 1. The left ventricle has normal systolic function with an ejection fraction of 60-65%. The cavity size was normal. Left  ventricular diastolic Doppler parameters are consistent with impaired relaxation.  2. The right ventricle has normal systolic function. The cavity was normal. There is no increase in right ventricular wall thickness.Unable to estimate RVSP __________  Myoview pending  Patient Profile     73 y.o. 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 Nancy Marus.  Assessment & Plan    1. Chest pain with moderate risk for cardiac etiology: -Ruled out -Currently, notes 2/10 chest pain and has noted this all night -Check one further troponin given ongoing chest pain all night -Echo with preserved EF and no WMA as above -Lexiscan Myoview this morning -If stress test is unrevealing consider non-cardiac etiologies  -NPO  2. Hyperglycemia: -A1c pending  3. 3.  Ongoing  tobacco abuse: -Nicotine patch -Complete cessation is advised  4.  Leukocytosis: -No obvious source of infection -Possibly inflammatory in the setting of #1 -Improving -Per IM  5. HLD: -Lipitor 40 mg daily -Follow up lipid and liver function in ~ 8 weeks   For questions or updates, please contact [facility] Please consult [email or web address] for contact info under Cardiology/STEMI.    Signed, Eula Listen, PA-C [facility] Pager: [phone] 05/19/2018, 7:19 AM

## 2018-05-19 NOTE — Progress Notes (Signed)
Discharge instructions explained to pt/ verbalized an understanding/ iv and tele removed/ will transport off unit via wheelchair.  

## 2018-05-19 NOTE — Progress Notes (Signed)
[facility] - [place] at [facility]   PATIENT NAME: Peter Galvan    MR#:  [id number]  DATE OF BIRTH:  [birth year]  SUBJECTIVE:  CHIEF COMPLAINT:   Chief Complaint  Patient presents with  . Chest Pain  Patient seen and evaluated today No complaints of chest pain No shortness of breath No palpitations  REVIEW OF SYSTEMS:    ROS  CONSTITUTIONAL: No documented fever. No fatigue, weakness. No weight gain, no weight loss.  EYES: No blurry or double vision.  ENT: No tinnitus. No postnasal drip. No redness of the oropharynx.  RESPIRATORY: No cough, no wheeze, no hemoptysis. No dyspnea.  CARDIOVASCULAR: No chest pain. No orthopnea. No palpitations. No syncope.  GASTROINTESTINAL: No nausea, no vomiting or diarrhea. No abdominal pain. No melena or hematochezia.  GENITOURINARY: No dysuria or hematuria.  ENDOCRINE: No polyuria or nocturia. No heat or cold intolerance.  HEMATOLOGY: No anemia. No bruising. No bleeding.  INTEGUMENTARY: No rashes. No lesions.  MUSCULOSKELETAL: No arthritis. No swelling. No gout.  NEUROLOGIC: No numbness, tingling, or ataxia. No seizure-type activity.  PSYCHIATRIC: No anxiety. No insomnia. No ADD.   DRUG ALLERGIES:   Allergies  Allergen Reactions  . Codeine Itching and Nausea And Vomiting  . Penicillins Hives    Did it involve swelling of the face/tongue/throat, SOB, or low BP? No Did it involve sudden or severe rash/hives, skin peeling, or any reaction on the inside of your mouth or nose? No Did you need to seek medical attention at a hospital or doctor's office? No When did it last happen?childhood If all above answers are "NO", may proceed with cephalosporin use.    VITALS:  Blood pressure 117/74, pulse 74, temperature 98.6 F (37 C), temperature source Oral, resp. rate 18, height 5\' 9"  (1.753 m), weight 61.4 kg, SpO2 100 %.  PHYSICAL EXAMINATION:   Physical Exam  GENERAL:  73 y.o.-year-old patient lying in the bed  with no acute distress.  EYES: Pupils equal, round, reactive to light and accommodation. No scleral icterus. Extraocular muscles intact.  HEENT: Head atraumatic, normocephalic. Oropharynx and nasopharynx clear.  NECK:  Supple, no jugular venous distention. No thyroid enlargement, no tenderness.  LUNGS: Normal breath sounds bilaterally, no wheezing, rales, rhonchi. No use of accessory muscles of respiration.  CARDIOVASCULAR: S1, S2 normal. No murmurs, rubs, or gallops.  ABDOMEN: Soft, nontender, nondistended. Bowel sounds present. No organomegaly or mass.  EXTREMITIES: No cyanosis, clubbing or edema b/l.    NEUROLOGIC: Cranial nerves II through XII are intact. No focal Motor or sensory deficits b/l.   PSYCHIATRIC: The patient is alert and oriented x 3.  SKIN: No obvious rash, lesion, or ulcer.   LABORATORY PANEL:   CBC Recent Labs  Lab 05/19/18 0431  WBC 13.1*  HGB 13.0  HCT 39.4  PLT 217   ------------------------------------------------------------------------------------------------------------------ Chemistries  Recent Labs  Lab 05/18/18 1241 05/18/18 1813 05/19/18 0431  NA 136  --  139  K 4.2  --  4.4  CL 102  --  106  CO2 26  --  25  GLUCOSE 111*  --  94  BUN 30*  --  29*  CREATININE 1.21  --  1.06  CALCIUM 8.9  --  8.6*  MG  --  2.2  --   AST 16  --   --   ALT 24  --   --   ALKPHOS 63  --   --   BILITOT 0.5  --   --    ------------------------------------------------------------------------------------------------------------------  Cardiac Enzymes Recent Labs  Lab 05/19/18 1058  TROPONINI <0.03   ------------------------------------------------------------------------------------------------------------------  RADIOLOGY:  Dg Chest 2 View  Result Date: 05/19/2018 CLINICAL DATA:  73 year old male with leukocytosis. EXAM: CHEST - 2 VIEW COMPARISON:  Prior chest x-ray 05/18/2018 FINDINGS: The lungs are clear and negative for focal airspace consolidation,  pulmonary edema or suspicious pulmonary nodule. Stable bronchitic changes. No pleural effusion or pneumothorax. Cardiac and mediastinal contours are within normal limits. No acute fracture or lytic or blastic osseous lesions. The visualized upper abdominal bowel gas pattern is unremarkable. IMPRESSION: Stable chest x-ray.  No active cardiopulmonary process. Electronically Signed   By: Malachy MoanHeath  McCullough M.D.   On: 05/19/2018 07:54   Dg Chest 2 View  Result Date: 05/18/2018 CLINICAL DATA:  Chest pain. EXAM: CHEST - 2 VIEW COMPARISON:  None. FINDINGS: The heart size and mediastinal contours are within normal limits. Both lungs are clear. No pneumothorax or pleural effusion is noted. The visualized skeletal structures are unremarkable. IMPRESSION: No active cardiopulmonary disease. Electronically Signed   By: Lupita RaiderJames  Green Jr M.D.   On: 05/18/2018 13:16     ASSESSMENT AND PLAN:   73 year old male patient with no significant past medical history currently under hospitalist service for chest pain.  -Chest pain Serial troponin negative Patient had a cardiac stress test pending results Continue aspirin Follow-up echocardiogram Continue beta-blocker and statin Cardiology evaluation appreciated  -Leukocytosis Probably stress related No evidence of infection  -Tobacco abuse Tobacco cessation counseled to the patient for 6 minutes Nicotine patch offered  All the records are reviewed and case discussed with Care Management/Social Worker. Management plans discussed with the patient, family and they are in agreement.  CODE STATUS: Full code  DVT Prophylaxis: SCDs  TOTAL TIME TAKING CARE OF THIS PATIENT:36 minutes.   POSSIBLE D/C IN 1 DAYS, DEPENDING ON CLINICAL CONDITION.  Ihor AustinPavan  M.D on 05/19/2018 at 11:42 AM  Between 7am to 6pm - Pager - [phone]  After 6pm go to [email or web address] - password [email or web address]  [facility] [place] [facility]  Office  [phone]  CC: Primary care  physician; Patient, No Pcp Per  Note: This dictation was prepared with Dragon dictation along with smaller phrase technology. Any transcriptional errors that result from this process are unintentional.

## 2018-05-19 NOTE — Care Management Obs Status (Signed)
MEDICARE OBSERVATION STATUS NOTIFICATION   Patient Details  Name: Peter Galvan MRN: [id number] Date of Birth: 04-13-1945   Medicare Observation Status Notification Given:  No  Discharge order placed in < 24hr of being placed on observation   Eber Hong, RN 05/19/2018, 3:47 PM

## 2018-05-19 NOTE — Discharge Summary (Signed)
[facility] - [place] at [facility]   PATIENT NAME: Peter Galvan    MR#:  [id number]  DATE OF BIRTH:  1945/11/29  DATE OF ADMISSION:  05/18/2018 ADMITTING PHYSICIAN: Campbell Stall, MD  DATE OF DISCHARGE: 05/19/2018  3:02 PM  PRIMARY CARE PHYSICIAN: Patient, No Pcp Per   ADMISSION DIAGNOSIS:  Nonspecific chest pain [R07.9]  DISCHARGE DIAGNOSIS:  Active Problems:   Chest pain Hyperlipidemia  SECONDARY DIAGNOSIS:   Past Medical History:  Diagnosis Date  . Alcohol abuse    a. reported seizure-like activity   . Tobacco abuse      ADMITTING HISTORY Peter Galvan  is a 73 y.o. male with no past medical history who presented to the ED with substernal chest pain that started this morning.  He was sitting in a swing in his backyard around 8:30 AM this morning, when he suddenly developed midsternal "chest pressure" that radiated up to his neck and to his mid back.  He had associated shortness of breath.  The pain lasted 3-4 minutes and then resolve on its own.  He then tried to walk back inside the house when he suddenly felt "lightheaded".  He then became very sweaty, so he called his neighbor, who called EMS.  He has no history of heart problems.  He does smoke 1 pack/day for 50 years.  In the ED, vitals were unremarkable.  Labs are significant for WBC 18.4.  Troponin was negative.  Chest x-ray was negative.  EKG is unremarkable.  Due to concern for unstable angina, hospitalists were called   HOSPITAL COURSE:  Patient was admitted to telemetry. Serial troponins were negative. Was seen by cardiology during hospitalization. Patient was worked up with echocardiogram and cardiac stress test. Stress test did not reveal any ischemic changes and wall motion abnormality. Patient will be discharged home on stain for hyperlipidemia.  CONSULTS OBTAINED:  Treatment Team:  Antonieta Iba, MD  DRUG ALLERGIES:   Allergies  Allergen Reactions  . Codeine Itching and  Nausea And Vomiting  . Penicillins Hives    Did it involve swelling of the face/tongue/throat, SOB, or low BP? No Did it involve sudden or severe rash/hives, skin peeling, or any reaction on the inside of your mouth or nose? No Did you need to seek medical attention at a hospital or doctor's office? No When did it last happen?childhood If all above answers are "NO", may proceed with cephalosporin use.    DISCHARGE MEDICATIONS:   Allergies as of 05/19/2018      Reactions   Codeine Itching, Nausea And Vomiting   Penicillins Hives   Did it involve swelling of the face/tongue/throat, SOB, or low BP? No Did it involve sudden or severe rash/hives, skin peeling, or any reaction on the inside of your mouth or nose? No Did you need to seek medical attention at a hospital or doctor's office? No When did it last happen?childhood If all above answers are "NO", may proceed with cephalosporin use.      Medication List    TAKE these medications   atorvastatin 40 MG tablet Commonly known as:  Lipitor Take 1 tablet (40 mg total) by mouth daily.       Today  Patient seen today No chest pain No shortness of breath Hemodynamically stable VITAL SIGNS:  Blood pressure 117/74, pulse 74, temperature 98.6 F (37 C), temperature source Oral, resp. rate 18, height  (1.753 m), weight 61.4 kg, SpO2 100 %.  I/O:  Intake/Output Summary (Last 24 hours) at 05/19/2018 2110 Last data filed at 05/19/2018 0448 Gross per 24 hour  Intake -  Output 350 ml  Net -350 ml    PHYSICAL EXAMINATION:  Physical Exam  GENERAL:  73 y.o.-year-old patient lying in the bed with no acute distress.  LUNGS: Normal breath sounds bilaterally, no wheezing, rales,rhonchi or crepitation. No use of accessory muscles of respiration.  CARDIOVASCULAR: S1, S2 normal. No murmurs, rubs, or gallops.  ABDOMEN: Soft, non-tender, non-distended. Bowel sounds present. No organomegaly or mass.  NEUROLOGIC: Moves all  4 extremities. PSYCHIATRIC: The patient is alert and oriented x 3.  SKIN: No obvious rash, lesion, or ulcer.   DATA REVIEW:   CBC Recent Labs  Lab 05/19/18 0431  WBC 13.1*  HGB 13.0  HCT 39.4  PLT 217    Chemistries  Recent Labs  Lab 05/18/18 1241 05/18/18 1813 05/19/18 0431  NA 136  --  139  K 4.2  --  4.4  CL 102  --  106  CO2 26  --  25  GLUCOSE 111*  --  94  BUN 30*  --  29*  CREATININE 1.21  --  1.06  CALCIUM 8.9  --  8.6*  MG  --  2.2  --   AST 16  --   --   ALT 24  --   --   ALKPHOS 63  --   --   BILITOT 0.5  --   --     Cardiac Enzymes Recent Labs  Lab 05/19/18 1058  TROPONINI <0.03    Microbiology Results  Results for orders placed or performed during the hospital encounter of 05/18/18  Urine Culture     Status: None   Collection Time: 05/18/18  2:22 PM  Result Value Ref Range Status   Specimen Description   Final    URINE, RANDOM Performed at [facility], [address] [place], [place] [address]    Special Requests   Final    NONE Performed at [facility], [address] [place], [place] [address]    Culture   Final    NO GROWTH Performed at [facility], [address] [place], [place] [address]    Report Status 05/19/2018 FINAL  Final    RADIOLOGY:  Dg Chest 2 View  Result Date: 05/19/2018 CLINICAL DATA:  73 year old male with leukocytosis. EXAM: CHEST - 2 VIEW COMPARISON:  Prior chest x-ray 05/18/2018 FINDINGS: The lungs are clear and negative for focal airspace consolidation, pulmonary edema or suspicious pulmonary nodule. Stable bronchitic changes. No pleural effusion or pneumothorax. Cardiac and mediastinal contours are within normal limits. No acute fracture or lytic or blastic osseous lesions. The visualized upper abdominal bowel gas pattern is unremarkable. IMPRESSION: Stable chest x-ray.  No active cardiopulmonary process. Electronically Signed   By: Malachy MoanHeath  McCullough M.D.   On: 05/19/2018 07:54    Dg Chest 2 View  Result Date: 05/18/2018 CLINICAL DATA:  Chest pain. EXAM: CHEST - 2 VIEW COMPARISON:  None. FINDINGS: The heart size and mediastinal contours are within normal limits. Both lungs are clear. No pneumothorax or pleural effusion is noted. The visualized skeletal structures are unremarkable. IMPRESSION: No active cardiopulmonary disease. Electronically Signed   By: Lupita RaiderJames  Green Jr M.D.   On: 05/18/2018 13:16   Nm Myocar Multi W/spect W/wall Motion / Ef  Result Date: 05/19/2018 Pharmacological myocardial perfusion imaging study with no significant  ischemia Normal wall motion, EF estimated at  86% No EKG changes concerning for ischemia at peak stress or in recovery. Low risk scan Signed, Dossie Arbour, MD, Ph.D [facility]    Follow up with PCP in 1 week.  Management plans discussed with the patient, family and they are in agreement.  CODE STATUS: Full code Code Status History    Date Active Date Inactive Code Status Order ID Comments User Context   05/18/2018 1453 05/19/2018 1808 Full Code [id number]  Mayo, Allyn Kenner, MD Inpatient      TOTAL TIME TAKING CARE OF THIS PATIENT ON DAY OF DISCHARGE: more than 35 minutes.   Ihor Austin M.D on 05/19/2018 at 9:10 PM  Between 7am to 6pm - Pager - [phone]  After 6pm go to [email or web address] - password [email or web address]  [facility] [place] [facility]  Office  [phone]  CC: Primary care physician; Patient, No Pcp Per  Note: This dictation was prepared with Dragon dictation along with smaller phrase technology. Any transcriptional errors that result from this process are unintentional.

## 2019-02-13 DIAGNOSIS — M4316 Spondylolisthesis, lumbar region: Secondary | ICD-10-CM | POA: Diagnosis not present

## 2019-02-13 DIAGNOSIS — M5416 Radiculopathy, lumbar region: Secondary | ICD-10-CM | POA: Diagnosis not present

## 2019-02-13 DIAGNOSIS — M48061 Spinal stenosis, lumbar region without neurogenic claudication: Secondary | ICD-10-CM | POA: Diagnosis not present

## 2019-02-13 DIAGNOSIS — M47896 Other spondylosis, lumbar region: Secondary | ICD-10-CM | POA: Diagnosis not present

## 2019-02-22 DIAGNOSIS — H40053 Ocular hypertension, bilateral: Secondary | ICD-10-CM | POA: Diagnosis not present

## 2019-03-29 DIAGNOSIS — M47896 Other spondylosis, lumbar region: Secondary | ICD-10-CM | POA: Diagnosis not present

## 2019-03-29 DIAGNOSIS — Z5181 Encounter for therapeutic drug level monitoring: Secondary | ICD-10-CM | POA: Diagnosis not present

## 2019-03-29 DIAGNOSIS — M5416 Radiculopathy, lumbar region: Secondary | ICD-10-CM | POA: Diagnosis not present

## 2019-03-29 DIAGNOSIS — Z79899 Other long term (current) drug therapy: Secondary | ICD-10-CM | POA: Diagnosis not present

## 2019-03-29 DIAGNOSIS — M4316 Spondylolisthesis, lumbar region: Secondary | ICD-10-CM | POA: Diagnosis not present

## 2019-04-05 DIAGNOSIS — Z79899 Other long term (current) drug therapy: Secondary | ICD-10-CM | POA: Diagnosis not present

## 2019-04-05 DIAGNOSIS — Z5181 Encounter for therapeutic drug level monitoring: Secondary | ICD-10-CM | POA: Diagnosis not present

## 2019-04-19 DIAGNOSIS — M5416 Radiculopathy, lumbar region: Secondary | ICD-10-CM | POA: Diagnosis not present

## 2019-05-10 DIAGNOSIS — M4316 Spondylolisthesis, lumbar region: Secondary | ICD-10-CM | POA: Diagnosis not present

## 2019-05-10 DIAGNOSIS — M48061 Spinal stenosis, lumbar region without neurogenic claudication: Secondary | ICD-10-CM | POA: Diagnosis not present

## 2019-05-10 DIAGNOSIS — M5416 Radiculopathy, lumbar region: Secondary | ICD-10-CM | POA: Diagnosis not present

## 2019-05-10 DIAGNOSIS — Z79899 Other long term (current) drug therapy: Secondary | ICD-10-CM | POA: Diagnosis not present

## 2019-05-10 DIAGNOSIS — M47896 Other spondylosis, lumbar region: Secondary | ICD-10-CM | POA: Diagnosis not present

## 2019-05-15 DIAGNOSIS — H40053 Ocular hypertension, bilateral: Secondary | ICD-10-CM | POA: Diagnosis not present

## 2019-08-21 DIAGNOSIS — H40053 Ocular hypertension, bilateral: Secondary | ICD-10-CM | POA: Diagnosis not present

## 2019-08-21 DIAGNOSIS — H2513 Age-related nuclear cataract, bilateral: Secondary | ICD-10-CM | POA: Diagnosis not present

## 2019-08-21 DIAGNOSIS — H40033 Anatomical narrow angle, bilateral: Secondary | ICD-10-CM | POA: Diagnosis not present

## 2019-09-06 DIAGNOSIS — M5416 Radiculopathy, lumbar region: Secondary | ICD-10-CM | POA: Diagnosis not present

## 2019-09-06 DIAGNOSIS — Z79899 Other long term (current) drug therapy: Secondary | ICD-10-CM | POA: Diagnosis not present

## 2019-09-06 DIAGNOSIS — M47896 Other spondylosis, lumbar region: Secondary | ICD-10-CM | POA: Diagnosis not present

## 2019-09-06 DIAGNOSIS — M48061 Spinal stenosis, lumbar region without neurogenic claudication: Secondary | ICD-10-CM | POA: Diagnosis not present

## 2019-09-06 DIAGNOSIS — M4316 Spondylolisthesis, lumbar region: Secondary | ICD-10-CM | POA: Diagnosis not present

## 2019-09-13 ENCOUNTER — Emergency Department: Payer: Medicare Other

## 2019-09-13 ENCOUNTER — Emergency Department
Admission: EM | Admit: 2019-09-13 | Discharge: 2019-09-13 | Disposition: A | Discharge status: Home / Self Care | Payer: Medicare Other | Attending: Emergency Medicine | Admitting: Emergency Medicine

## 2019-09-13 ENCOUNTER — Encounter: Payer: Self-pay | Admitting: Emergency Medicine

## 2019-09-13 ENCOUNTER — Other Ambulatory Visit: Payer: Self-pay

## 2019-09-13 DIAGNOSIS — F17210 Nicotine dependence, cigarettes, uncomplicated: Secondary | ICD-10-CM | POA: Insufficient documentation

## 2019-09-13 DIAGNOSIS — J309 Allergic rhinitis, unspecified: Secondary | ICD-10-CM | POA: Diagnosis not present

## 2019-09-13 DIAGNOSIS — F17200 Nicotine dependence, unspecified, uncomplicated: Secondary | ICD-10-CM | POA: Diagnosis not present

## 2019-09-13 DIAGNOSIS — R05 Cough: Secondary | ICD-10-CM | POA: Diagnosis not present

## 2019-09-13 LAB — CBC WITH DIFFERENTIAL/PLATELET
Abs Immature Granulocytes: 0.04 10*3/uL (ref 0.00–0.07)
Basophils Absolute: 0.1 10*3/uL (ref 0.0–0.1)
Basophils Relative: 1 %
Eosinophils Absolute: 0.2 10*3/uL (ref 0.0–0.5)
Eosinophils Relative: 2 %
HCT: 47.8 % (ref 39.0–52.0)
Hemoglobin: 16.2 g/dL (ref 13.0–17.0)
Immature Granulocytes: 0 %
Lymphocytes Relative: 25 %
Lymphs Abs: 2.3 10*3/uL (ref 0.7–4.0)
MCH: 31.2 pg (ref 26.0–34.0)
MCHC: 33.9 g/dL (ref 30.0–36.0)
MCV: 92.1 fL (ref 80.0–100.0)
Monocytes Absolute: 0.6 10*3/uL (ref 0.1–1.0)
Monocytes Relative: 6 %
Neutro Abs: 6.1 10*3/uL (ref 1.7–7.7)
Neutrophils Relative %: 66 %
Platelets: 219 10*3/uL (ref 150–400)
RBC: 5.19 MIL/uL (ref 4.22–5.81)
RDW: 13.2 % (ref 11.5–15.5)
WBC: 9.3 10*3/uL (ref 4.0–10.5)
nRBC: 0 % (ref 0.0–0.2)

## 2019-09-13 LAB — COMPREHENSIVE METABOLIC PANEL
ALT: 17 U/L (ref 0–44)
AST: 8 U/L — ABNORMAL LOW (ref 15–41)
Albumin: 4.8 g/dL (ref 3.5–5.0)
Alkaline Phosphatase: 88 U/L (ref 38–126)
Anion gap: 13 (ref 5–15)
BUN: <5 mg/dL — ABNORMAL LOW (ref 8–23)
CO2: 25 mmol/L (ref 22–32)
Calcium: 9.8 mg/dL (ref 8.9–10.3)
Chloride: 102 mmol/L (ref 98–111)
Creatinine, Ser: 1.08 mg/dL (ref 0.61–1.24)
GFR calc Af Amer: >60 mL/min (ref >60)
GFR calc non Af Amer: >60 mL/min (ref >60)
Glucose, Bld: 92 mg/dL (ref 70–99)
Potassium: 4.4 mmol/L (ref 3.5–5.1)
Sodium: 140 mmol/L (ref 135–145)
Total Bilirubin: 0.6 mg/dL (ref 0.3–1.2)
Total Protein: 8.1 g/dL (ref 6.5–8.1)

## 2019-09-13 MED ORDER — FLUTICASONE PROPIONATE 50 MCG/ACT NA SUSP: 2.0000 | Freq: Every day | NASAL | 2 refills | Status: DC

## 2019-09-13 NOTE — ED Notes (Signed)
See triage note  Presents with sinus congestion and with cough  States sx's started about 1 year ago  Low grade temp noted on arrival

## 2019-09-13 NOTE — ED Triage Notes (Signed)
C/O sinus congestion x 1 year and productive cough for clear sputum.  AAOx3.  Skin warm and dry. NAD

## 2019-09-13 NOTE — ED Provider Notes (Signed)
[facility] Emergency Department Provider Note  ____________________________________________   First MD Initiated Contact with Patient 09/13/19 1133     (approximate)  I have reviewed the triage vital signs and the nursing notes.   HISTORY  Chief Complaint Facial Pain and Cough   HPI Peter Galvan is a 74 y.o. male presents to the ED with complaint of sinus congestion and productive cough for 1 year.  Patient states that there is been no fever, chills, nausea or vomiting.  He denies any change in taste or smell.  Patient states he has been vaccinated for Covid.  Patient smokes daily and currently 1 pack/day.  He has not seen any medical personnel for this complaint as he does not have a PCP.  He denies any difficulty breathing or shortness of breath.  He denies any pain.       Past Medical History:  Diagnosis Date  . Alcohol abuse    a. reported seizure-like activity   . Tobacco abuse     Patient Active Problem List   Diagnosis Date Noted  . Chest pain 05/18/2018    History reviewed. No pertinent surgical history.  Prior to Admission medications   Medication Sig Start Date End Date Taking? Authorizing Provider  atorvastatin (LIPITOR) 40 MG tablet Take 1 tablet (40 mg total) by mouth daily. 05/19/18 05/19/19  Ihor Austin, MD  fluticasone (FLONASE) 50 MCG/ACT nasal spray Place 2 sprays into both nostrils daily. 09/13/19 09/12/20  Tommi Rumps, PA-C    Allergies Codeine and Penicillins  Family History  Problem Relation Age of Onset  . Hypertension Father   . Heart disease Son        a. s/p heart transplant     Social History Social History   Tobacco Use  . Smoking status: Current Every Day Smoker    Packs/day: 1.00    Years: 50.00    Pack years: 50.00    Types: Cigarettes  . Smokeless tobacco: Never Used  Substance Use Topics  . Alcohol use: Not Currently    Comment: a.  Previously reports being a heavy drinker though has  not drank in 15 years  . Drug use: Yes    Types: Marijuana    Review of Systems Constitutional: No fever/chills Eyes: No visual changes. ENT: No sore throat.  Positive sinus congestion. Cardiovascular: Denies chest pain. Respiratory: Denies shortness of breath.  Positive productive cough chronically. Gastrointestinal: No abdominal pain.  No nausea, no vomiting.  No diarrhea.  Genitourinary: Negative for dysuria. Musculoskeletal: Negative for muscle aches. Skin: Negative for rash. Neurological: Negative for headaches, focal weakness or numbness.   ____________________________________________   PHYSICAL EXAM:  VITAL SIGNS: ED Triage Vitals  Enc Vitals Group     BP 09/13/19 0909 (!) 162/98     Pulse Rate 09/13/19 0909 84     Resp 09/13/19 0909 16     Temp 09/13/19 0909 99 F (37.2 C)     Temp Source 09/13/19 0909 Oral     SpO2 09/13/19 0909 100 %     Weight 09/13/19 0910 135 lb 5.8 oz (61.4 kg)     Height 09/13/19 0910 5\' 9"  (1.753 m)     Head Circumference --      Peak Flow --      Pain Score 09/13/19 0909 0     Pain Loc --      Pain Edu? --      Excl. in GC? --  Constitutional: Alert and oriented. Well appearing and in no acute distress. Eyes: Conjunctivae are normal. PERRL. EOMI. Head: Atraumatic. Nose: Mild congestion/negative presently rhinnorhea. Mouth/Throat: Mucous membranes are moist.  Oropharynx non-erythematous. Neck: No stridor.   Cardiovascular: Normal rate, regular rhythm. Grossly normal heart sounds.  Good peripheral circulation. Respiratory: Normal respiratory effort.  No retractions. Lungs CTAB. Gastrointestinal: Soft and nontender. No distention.  Musculoskeletal: Moves upper and lower extremities any difficulty normal gait was noted. Neurologic:  Normal speech and language. No gross focal neurologic deficits are appreciated. No gait instability. Skin:  Skin is warm, dry and intact. No rash noted. Psychiatric: Mood and affect are normal. Speech  and behavior are normal.  ____________________________________________   LABS (all labs ordered are listed, but only abnormal results are displayed)  Labs Reviewed  COMPREHENSIVE METABOLIC PANEL - Abnormal; Notable for the following components:      Result Value   BUN <5 (*)    AST 8 (*)    All other components within normal limits  CBC WITH DIFFERENTIAL/PLATELET     RADIOLOGY   Official radiology report(s): DG Chest 2 View  Result Date: 09/13/2019 CLINICAL DATA:  Productive cough 1 year.  Smoker. EXAM: CHEST - 2 VIEW COMPARISON:  05/19/2018 FINDINGS: The heart size and mediastinal contours are within normal limits. Both lungs are clear. The visualized skeletal structures are unremarkable. IMPRESSION: No active cardiopulmonary disease. Electronically Signed   By: Marlan Palau M.D.   On: 09/13/2019 14:10    ____________________________________________   PROCEDURES  Procedure(s) performed (including Critical Care):  Procedures   ____________________________________________   INITIAL IMPRESSION / ASSESSMENT AND PLAN / ED COURSE  As part of my medical decision making, I reviewed the following data within the electronic MEDICAL RECORD NUMBER Notes from prior ED visits and [place] Controlled Substance Database  Peter Galvan was evaluated in Emergency Department on 09/13/2019 for the symptoms described in the history of present illness. He was evaluated in the context of the global COVID-19 pandemic, which necessitated consideration that the patient might be at risk for infection with the SARS-CoV-2 virus that causes COVID-19. Institutional protocols and algorithms that pertain to the evaluation of patients at risk for COVID-19 are in a state of rapid change based on information released by regulatory bodies including the CDC and federal and state organizations. These policies and algorithms were followed during the patient's care in the ED.  74 year old male presents to the ED  with complaint of sinus congestion and productive cough for the last year.  He has not seen anyone for this complaint as he does not have a PCP.  Patient reports that he continues to smoke 1 pack of cigarettes per day.  He denies any shortness of breath or difficulty breathing.  Chest x-ray showed no cardiopulmonary disease and lab work was benign.  Patient was given a prescription for Flonase nasal spray to begin using each nare once a day.  He is encouraged to call the clinics listed on his discharge papers so that he may obtain a primary care provider. ____________________________________________   FINAL CLINICAL IMPRESSION(S) / ED DIAGNOSES  Final diagnoses:  Allergic rhinitis, unspecified seasonality, unspecified trigger     ED Discharge Orders         Ordered    fluticasone (FLONASE) 50 MCG/ACT nasal spray  Daily     Discontinue  Reprint     09/13/19 1424           Note:  This document was prepared using  Dragon Chemical engineer and may include unintentional dictation errors.    Tommi Rumps, PA-C 09/13/19 1536    Arnaldo Natal, MD 09/19/19 [phone]

## 2019-09-13 NOTE — Discharge Instructions (Addendum)
Call the clinics listed on your discharge papers to see if you can establish a primary care provider. Begin using the Flonase nasal spray 2 sprays on each side of your nose once daily. This may take approximately 5 to 7 days for you to see a difference. Continue drinking lots of fluids. Decrease smoking if at all possible.

## 2019-12-06 DIAGNOSIS — M25512 Pain in left shoulder: Secondary | ICD-10-CM | POA: Diagnosis not present

## 2020-02-07 DIAGNOSIS — H40003 Preglaucoma, unspecified, bilateral: Secondary | ICD-10-CM | POA: Diagnosis not present

## 2020-02-07 DIAGNOSIS — H40009 Preglaucoma, unspecified, unspecified eye: Secondary | ICD-10-CM | POA: Diagnosis not present

## 2020-03-29 ENCOUNTER — Other Ambulatory Visit: Payer: Self-pay

## 2020-03-29 ENCOUNTER — Inpatient Hospital Stay
Admission: EM | Admit: 2020-03-29 | Discharge: 2020-03-31 | DRG: 301 | Disposition: A | Discharge status: Home Health | Payer: Medicare Other | Attending: Student | Admitting: Student

## 2020-03-29 ENCOUNTER — Encounter: Payer: Self-pay | Admitting: Emergency Medicine

## 2020-03-29 ENCOUNTER — Other Ambulatory Visit: Payer: Self-pay | Admitting: Student

## 2020-03-29 ENCOUNTER — Emergency Department
Admission: RE | Admit: 2020-03-29 | Discharge: 2020-03-29 | Disposition: A | Discharge status: Home / Self Care | Payer: Medicare Other | Source: Ambulatory Visit | Attending: Orthopedic Surgery | Admitting: Orthopedic Surgery

## 2020-03-29 ENCOUNTER — Other Ambulatory Visit: Payer: Self-pay | Admitting: Orthopedic Surgery

## 2020-03-29 DIAGNOSIS — R2681 Unsteadiness on feet: Secondary | ICD-10-CM | POA: Diagnosis not present

## 2020-03-29 DIAGNOSIS — I82431 Acute embolism and thrombosis of right popliteal vein: Secondary | ICD-10-CM | POA: Diagnosis present

## 2020-03-29 DIAGNOSIS — M160 Bilateral primary osteoarthritis of hip: Secondary | ICD-10-CM | POA: Diagnosis not present

## 2020-03-29 DIAGNOSIS — I82411 Acute embolism and thrombosis of right femoral vein: Principal | ICD-10-CM | POA: Diagnosis present

## 2020-03-29 DIAGNOSIS — M7989 Other specified soft tissue disorders: Secondary | ICD-10-CM

## 2020-03-29 DIAGNOSIS — I82441 Acute embolism and thrombosis of right tibial vein: Secondary | ICD-10-CM | POA: Diagnosis present

## 2020-03-29 DIAGNOSIS — I82451 Acute embolism and thrombosis of right peroneal vein: Secondary | ICD-10-CM | POA: Diagnosis present

## 2020-03-29 DIAGNOSIS — I82491 Acute embolism and thrombosis of other specified deep vein of right lower extremity: Secondary | ICD-10-CM | POA: Diagnosis not present

## 2020-03-29 DIAGNOSIS — R2241 Localized swelling, mass and lump, right lower limb: Secondary | ICD-10-CM

## 2020-03-29 DIAGNOSIS — Z720 Tobacco use: Secondary | ICD-10-CM

## 2020-03-29 DIAGNOSIS — F1010 Alcohol abuse, uncomplicated: Secondary | ICD-10-CM | POA: Diagnosis present

## 2020-03-29 DIAGNOSIS — I82409 Acute embolism and thrombosis of unspecified deep veins of unspecified lower extremity: Secondary | ICD-10-CM | POA: Diagnosis present

## 2020-03-29 DIAGNOSIS — F1021 Alcohol dependence, in remission: Secondary | ICD-10-CM | POA: Diagnosis present

## 2020-03-29 DIAGNOSIS — Z20822 Contact with and (suspected) exposure to covid-19: Secondary | ICD-10-CM | POA: Diagnosis present

## 2020-03-29 DIAGNOSIS — M1990 Unspecified osteoarthritis, unspecified site: Secondary | ICD-10-CM | POA: Diagnosis present

## 2020-03-29 DIAGNOSIS — Z79899 Other long term (current) drug therapy: Secondary | ICD-10-CM

## 2020-03-29 DIAGNOSIS — F17210 Nicotine dependence, cigarettes, uncomplicated: Secondary | ICD-10-CM | POA: Diagnosis present

## 2020-03-29 LAB — CBC WITH DIFFERENTIAL/PLATELET
Abs Immature Granulocytes: 0.05 10*3/uL (ref 0.00–0.07)
Basophils Absolute: 0.1 10*3/uL (ref 0.0–0.1)
Basophils Relative: 1 %
Eosinophils Absolute: 0.1 10*3/uL (ref 0.0–0.5)
Eosinophils Relative: 1 %
HCT: 45.7 % (ref 39.0–52.0)
Hemoglobin: 15.2 g/dL (ref 13.0–17.0)
Immature Granulocytes: 1 %
Lymphocytes Relative: 20 %
Lymphs Abs: 2.2 10*3/uL (ref 0.7–4.0)
MCH: 30.3 pg (ref 26.0–34.0)
MCHC: 33.3 g/dL (ref 30.0–36.0)
MCV: 91 fL (ref 80.0–100.0)
Monocytes Absolute: 0.7 10*3/uL (ref 0.1–1.0)
Monocytes Relative: 7 %
Neutro Abs: 7.8 10*3/uL — ABNORMAL HIGH (ref 1.7–7.7)
Neutrophils Relative %: 70 %
Platelets: 211 10*3/uL (ref 150–400)
RBC: 5.02 MIL/uL (ref 4.22–5.81)
RDW: 13.2 % (ref 11.5–15.5)
WBC: 11 10*3/uL — ABNORMAL HIGH (ref 4.0–10.5)
nRBC: 0 % (ref 0.0–0.2)

## 2020-03-29 LAB — COMPREHENSIVE METABOLIC PANEL
ALT: 17 U/L (ref 0–44)
AST: 19 U/L (ref 15–41)
Albumin: 4.3 g/dL (ref 3.5–5.0)
Alkaline Phosphatase: 86 U/L (ref 38–126)
Anion gap: 10 (ref 5–15)
BUN: 24 mg/dL — ABNORMAL HIGH (ref 8–23)
CO2: 25 mmol/L (ref 22–32)
Calcium: 9.4 mg/dL (ref 8.9–10.3)
Chloride: 104 mmol/L (ref 98–111)
Creatinine, Ser: 1.15 mg/dL (ref 0.61–1.24)
GFR, Estimated: >60 mL/min (ref >60)
Glucose, Bld: 97 mg/dL (ref 70–99)
Potassium: 4.5 mmol/L (ref 3.5–5.1)
Sodium: 139 mmol/L (ref 135–145)
Total Bilirubin: 1 mg/dL (ref 0.3–1.2)
Total Protein: 7.8 g/dL (ref 6.5–8.1)

## 2020-03-29 LAB — PROTIME-INR
INR: 0.9 (ref 0.8–1.2)
Prothrombin Time: 12.2 seconds (ref 11.4–15.2)

## 2020-03-29 LAB — RESP PANEL BY RT-PCR (FLU A&B, COVID) ARPGX2
Influenza A by PCR: NEGATIVE
Influenza B by PCR: NEGATIVE
SARS Coronavirus 2 by RT PCR: NEGATIVE

## 2020-03-29 LAB — PHOSPHORUS: Phosphorus: 2.7 mg/dL (ref 2.5–4.6)

## 2020-03-29 LAB — MAGNESIUM: Magnesium: 2.4 mg/dL (ref 1.7–2.4)

## 2020-03-29 LAB — HEPARIN LEVEL (UNFRACTIONATED): Heparin Unfractionated: 0.65 IU/mL (ref 0.30–0.70)

## 2020-03-29 LAB — APTT: aPTT: 29 seconds (ref 24–36)

## 2020-03-29 MED ORDER — ONDANSETRON HCL 4 MG/2ML IJ SOLN: 4.0000 mg | Freq: Four times a day (QID) | INTRAMUSCULAR | Status: DC | PRN

## 2020-03-29 MED ORDER — SODIUM CHLORIDE 0.9% FLUSH: 3.0000 mL | INTRAVENOUS | Status: DC | PRN

## 2020-03-29 MED ORDER — SODIUM CHLORIDE 0.9 % IV SOLN: 250.0000 mL | INTRAVENOUS | Status: DC | PRN

## 2020-03-29 MED ORDER — ACETAMINOPHEN 650 MG RE SUPP: 650.0000 mg | Freq: Four times a day (QID) | RECTAL | Status: DC | PRN

## 2020-03-29 MED ORDER — THIAMINE HCL 100 MG/ML IJ SOLN
100.0000 mg | Freq: Every day | INTRAMUSCULAR | Status: DC
  Filled 2020-03-29: qty 2

## 2020-03-29 MED ORDER — SODIUM CHLORIDE 0.9% FLUSH
3.0000 mL | Freq: Two times a day (BID) | INTRAVENOUS | Status: DC
  Administered 2020-03-29 – 2020-03-31 (×4): 3 mL via INTRAVENOUS

## 2020-03-29 MED ORDER — FOLIC ACID 1 MG PO TABS
1.0000 mg | ORAL_TABLET | Freq: Every day | ORAL | Status: DC
  Administered 2020-03-29 – 2020-03-31 (×3): 1 mg via ORAL
  Filled 2020-03-29 (×3): qty 1

## 2020-03-29 MED ORDER — ACETAMINOPHEN 325 MG PO TABS: 650.0000 mg | ORAL_TABLET | Freq: Four times a day (QID) | ORAL | Status: DC | PRN

## 2020-03-29 MED ORDER — ADULT MULTIVITAMIN W/MINERALS CH
1.0000 | ORAL_TABLET | Freq: Every day | ORAL | Status: DC
  Administered 2020-03-29 – 2020-03-31 (×3): 1 via ORAL
  Filled 2020-03-29 (×3): qty 1

## 2020-03-29 MED ORDER — HEPARIN (PORCINE) 25000 UT/250ML-% IV SOLN
1100.0000 [IU]/h | INTRAVENOUS | Status: DC
  Administered 2020-03-29 – 2020-03-30 (×2): 1100 [IU]/h via INTRAVENOUS
  Filled 2020-03-29 (×3): qty 250

## 2020-03-29 MED ORDER — ONDANSETRON HCL 4 MG PO TABS: 4.0000 mg | ORAL_TABLET | Freq: Four times a day (QID) | ORAL | Status: DC | PRN

## 2020-03-29 MED ORDER — LORAZEPAM 1 MG PO TABS: 1.0000 mg | ORAL_TABLET | ORAL | Status: DC | PRN

## 2020-03-29 MED ORDER — HEPARIN BOLUS VIA INFUSION
4400.0000 [IU] | Freq: Once | INTRAVENOUS | Status: AC
  Administered 2020-03-29: 4400 [IU] via INTRAVENOUS
  Filled 2020-03-29: qty 4400

## 2020-03-29 MED ORDER — LORAZEPAM 2 MG/ML IJ SOLN: 1.0000 mg | INTRAMUSCULAR | Status: DC | PRN

## 2020-03-29 MED ORDER — FLUTICASONE PROPIONATE 50 MCG/ACT NA SUSP
2.0000 | Freq: Every day | NASAL | Status: DC
  Administered 2020-03-31: 2 via NASAL
  Filled 2020-03-29: qty 16

## 2020-03-29 MED ORDER — SODIUM CHLORIDE 0.9% FLUSH
3.0000 mL | Freq: Two times a day (BID) | INTRAVENOUS | Status: DC
  Administered 2020-03-29 – 2020-03-31 (×4): 3 mL via INTRAVENOUS

## 2020-03-29 MED ORDER — THIAMINE HCL 100 MG PO TABS
100.0000 mg | ORAL_TABLET | Freq: Every day | ORAL | Status: DC
  Administered 2020-03-29 – 2020-03-31 (×3): 100 mg via ORAL
  Filled 2020-03-29 (×3): qty 1

## 2020-03-29 MED ORDER — NICOTINE 21 MG/24HR TD PT24
21.0000 mg | MEDICATED_PATCH | Freq: Every day | TRANSDERMAL | Status: DC
  Administered 2020-03-29 – 2020-03-31 (×3): 21 mg via TRANSDERMAL
  Filled 2020-03-29 (×3): qty 1

## 2020-03-29 NOTE — ED Provider Notes (Signed)
[facility] Emergency Department Provider Note  ____________________________________________   Event Date/Time   First MD Initiated Contact with Patient 03/29/20 1223     (approximate)  I have reviewed the triage vital signs and the nursing notes.   HISTORY  Chief Complaint Leg Pain    HPI Peter Galvan is a 75 y.o. male with prior history of alcohol abuse who comes in for right leg swelling.  Patient reports for the past 3 numbness feeling in his leg and then noticing his right calf swelling up and some pain that was constant, nothing made it better, nothing made it worse.  Patient went in for an ultrasound today and was told that there was a blood clot and so told to go to the ER for evaluation.  He denies any shortness of breath, chest pain, pleuritic pain or any concerns for DVT.  Denies a history of cancer.    Past Medical History:  Diagnosis Date  . Alcohol abuse    a. reported seizure-like activity   . Tobacco abuse     Patient Active Problem List   Diagnosis Date Noted  . Chest pain 05/18/2018    History reviewed. No pertinent surgical history.  Prior to Admission medications   Medication Sig Start Date End Date Taking? Authorizing Provider  atorvastatin (LIPITOR) 40 MG tablet Take 1 tablet (40 mg total) by mouth daily. 05/19/18 05/19/19  Ihor Austin, MD  fluticasone (FLONASE) 50 MCG/ACT nasal spray Place 2 sprays into both nostrils daily. 09/13/19 09/12/20  Tommi Rumps, PA-C    Allergies Codeine and Penicillins  Family History  Problem Relation Age of Onset  . Hypertension Father   . Heart disease Son        a. s/p heart transplant     Social History Social History   Tobacco Use  . Smoking status: Current Every Day Smoker    Packs/day: 1.00    Years: 50.00    Pack years: 50.00    Types: Cigarettes  . Smokeless tobacco: Never Used  Substance Use Topics  . Alcohol use: Not Currently    Comment: a.  Previously  reports being a heavy drinker though has not drank in 15 years  . Drug use: Yes    Types: Marijuana      Review of Systems Constitutional: No fever/chills Eyes: No visual changes. ENT: No sore throat. Cardiovascular: Denies chest pain. Respiratory: Denies shortness of breath. Gastrointestinal: No abdominal pain.  No nausea, no vomiting.  No diarrhea.  No constipation. Genitourinary: Negative for dysuria. Musculoskeletal: Negative for back pain.  Right leg pain Skin: Negative for rash. Neurological: Negative for headaches, focal weakness or numbness. All other ROS negative ____________________________________________   PHYSICAL EXAM:  VITAL SIGNS: ED Triage Vitals  Enc Vitals Group     BP 03/29/20 1157 (!) 150/103     Pulse Rate 03/29/20 1157 89     Resp 03/29/20 1157 (!) 22     Temp 03/29/20 1157 98.3 F (36.8 C)     Temp Source 03/29/20 1157 Oral     SpO2 03/29/20 1157 100 %     Weight 03/29/20 1155 140 lb (63.5 kg)     Height 03/29/20 1155 5\' 9"  (1.753 m)     Head Circumference --      Peak Flow --      Pain Score --      Pain Loc --      Pain Edu? --  Excl. in GC? --     Constitutional: Alert and oriented. Well appearing and in no acute distress. Eyes: Conjunctivae are normal. EOMI. Head: Atraumatic. Nose: No congestion/rhinnorhea. Mouth/Throat: Mucous membranes are moist.   Neck: No stridor. Trachea Midline. FROM Cardiovascular: Normal rate, regular rhythm. Grossly normal heart sounds.  Good peripheral circulation. Respiratory: Normal respiratory effort.  No retractions. Lungs CTAB. Gastrointestinal: Soft and nontender. No distention. No abdominal bruits.  Musculoskeletal: Right leg with some pain and swelling noted to his calf.  2+ distal pulse.  Little redness to shin  Neurologic:  Normal speech and language. No gross focal neurologic deficits are appreciated.  Skin:  Skin is warm, dry and intact. No rash noted. Psychiatric: Mood and affect are  normal. Speech and behavior are normal. GU: Deferred   ____________________________________________   LABS (all labs ordered are listed, but only abnormal results are displayed)  Labs Reviewed  CBC WITH DIFFERENTIAL/PLATELET - Abnormal; Notable for the following components:      Result Value   WBC 11.0 (*)    Neutro Abs 7.8 (*)    All other components within normal limits  COMPREHENSIVE METABOLIC PANEL - Abnormal; Notable for the following components:   BUN 24 (*)    All other components within normal limits  PROTIME-INR  APTT   ____________________________________________   ED ECG REPORT I, Concha Se, the attending physician, personally viewed and interpreted this ECG.  Normal sinus rate of 77, no ST elevation, no T wave inversions except for lead III, normal intervals ____________________________________________  RADIOLOGY   Official radiology report(s): US Venous Img Lower Unilateral Right (DVT)  Result Date: 03/29/2020 CLINICAL DATA:  Right leg swelling for 5 days EXAM: RIGHT LOWER EXTREMITY VENOUS DOPPLER ULTRASOUND TECHNIQUE: Gray-scale sonography with graded compression, as well as color Doppler and duplex ultrasound were performed to evaluate the lower extremity deep venous systems from the level of the common femoral vein and including the common femoral, femoral, profunda femoral, popliteal and calf veins including the posterior tibial, peroneal and gastrocnemius veins when visible. The superficial great saphenous vein was also interrogated. Spectral Doppler was utilized to evaluate flow at rest and with distal augmentation maneuvers in the common femoral, femoral and popliteal veins. COMPARISON:  None. FINDINGS: Contralateral Common Femoral Vein: Respiratory phasicity is normal and symmetric with the symptomatic side. No evidence of thrombus. Normal compressibility. Common Femoral Vein: No evidence of thrombus. Normal compressibility, respiratory phasicity and response  to augmentation. Saphenofemoral Junction: No evidence of thrombus. Normal compressibility and flow on color Doppler imaging. Profunda Femoral Vein: No evidence of thrombus. Normal compressibility and flow on color Doppler imaging. Femoral Vein: Echogenic occlusive thrombus within the proximal, mid, and distal portions of the right femoral vein. Popliteal Vein: Occlusive popliteal vein thrombus. Calf Veins: Occlusive thrombus is also evident within the posterior tibial and peroneal veins. Superficial Great Saphenous Vein: No evidence of thrombus. Normal compressibility. Venous Reflux:  None. Other Findings:  None. IMPRESSION: Positive for occlusive deep venous thrombosis involving the right femoral vein, popliteal vein, posterior tibial vein, and peroneal vein. These results will be called to the ordering clinician or representative by the Radiologist Assistant, and communication documented in the PACS or [employer]. Electronically Signed   By: Duanne Guess D.O.   On: 03/29/2020 12:00    ____________________________________________   PROCEDURES  Procedure(s) performed (including Critical Care):  .Critical Care Performed by: Concha Se, MD Authorized by: Concha Se, MD   Critical care provider statement:  Critical care time (minutes):  35   Critical care was necessary to treat or prevent imminent or life-threatening deterioration of the following conditions: Large DVT requiring heparin.   Critical care was time spent personally by me on the following activities:  Discussions with consultants, evaluation of patient's response to treatment, examination of patient, ordering and performing treatments and interventions, ordering and review of laboratory studies, ordering and review of radiographic studies, pulse oximetry, re-evaluation of patient's condition, obtaining history from patient or surrogate and review of old  charts     ____________________________________________   INITIAL IMPRESSION / ASSESSMENT AND PLAN / ED COURSE  Peter Galvan was evaluated in Emergency Department on 03/29/2020 for the symptoms described in the history of present illness. He was evaluated in the context of the global COVID-19 pandemic, which necessitated consideration that the patient might be at risk for infection with the SARS-CoV-2 virus that causes COVID-19. Institutional protocols and algorithms that pertain to the evaluation of patients at risk for COVID-19 are in a state of rapid change based on information released by regulatory bodies including the CDC and federal and state organizations. These policies and algorithms were followed during the patient's care in the ED.    Patient is a 75 year old male who is otherwise healthy who comes in for right leg pain in setting of known DVT found on ultrasound.  No evidence of phlegmasia cerulea dolens on examination.  He has no symptoms to suggest PE.  Discussed with Dr. Gilda Crease from vascular surgery.  Patient is having difficulty with ambulating and lives alone.  He would be a candidate for thrombectomy.  We will get patient to medicine on heparin drip       ____________________________________________   FINAL CLINICAL IMPRESSION(S) / ED DIAGNOSES   Final diagnoses:  Acute deep vein thrombosis (DVT) of other specified vein of right lower extremity (HCC)      MEDICATIONS GIVEN DURING THIS VISIT:  Medications  fluticasone (FLONASE) 50 MCG/ACT nasal spray 2 spray (has no administration in time range)  sodium chloride flush (NS) 0.9 % injection 3 mL (has no administration in time range)  sodium chloride flush (NS) 0.9 % injection 3 mL (has no administration in time range)  0.9 %  sodium chloride infusion (has no administration in time range)  acetaminophen (TYLENOL) tablet 650 mg (has no administration in time range)    Or  acetaminophen (TYLENOL) suppository  650 mg (has no administration in time range)  ondansetron (ZOFRAN) tablet 4 mg (has no administration in time range)    Or  ondansetron (ZOFRAN) injection 4 mg (has no administration in time range)  heparin bolus via infusion 4,400 Units (has no administration in time range)  heparin ADULT infusion 100 units/mL (25000 units/258mL) (has no administration in time range)     ED Discharge Orders    None       Note:  This document was prepared using Dragon voice recognition software and may include unintentional dictation errors.   Concha Se, MD 03/29/20 [phone]

## 2020-03-29 NOTE — ED Notes (Signed)
See triage note, pt reports right leg pain x1 week and states he has blood clot. Minimal swelling noted

## 2020-03-29 NOTE — Consult Note (Signed)
[facility] Vascular Consult Note  MRN : [id number]  Peter Galvan is a 75 y.o. (18-May-1945) male who presents with chief complaint of  Chief Complaint  Patient presents with  . Leg Pain  .  History of Present Illness:   I am asked to evaluate the patient by Dr. Fuller Plan.  Patient is a 75 year old gentleman who presented to [facility] earlier today because of an abrupt onset of profound pain in his right calf.  The pain was so intense that he was unable to even walk around his house.  He notes that it is continuous.  It began abruptly.  It is associated with significant swelling of the calf but no swelling of the knee or thigh area.  Walking or standing makes the symptoms much worse.  Resting in bed alleviates the discomfort somewhat.  Duplex ultrasound of the right lower extremity was obtained which demonstrates tibial popliteal and superficial femoral vein DVT.  Patient states he has never had a DVT before.  No history of hypercoagulability in the family.  He denies any sedentary behavior recent injuries or infection such as Covid that could have been an inciting event.  Current Facility-Administered Medications  Medication Dose Route Frequency Provider Last Rate Last Admin  . 0.9 %  sodium chloride infusion  250 mL Intravenous PRN Agbata, Tochukwu, MD      . 0.9 %  sodium chloride infusion  250 mL Intravenous PRN Agbata, Tochukwu, MD      . acetaminophen (TYLENOL) tablet 650 mg  650 mg Oral Q6H PRN Agbata, Tochukwu, MD       Or  . acetaminophen (TYLENOL) suppository 650 mg  650 mg Rectal Q6H PRN Agbata, Tochukwu, MD      . Melene Muller ON 03/30/2020] fluticasone (FLONASE) 50 MCG/ACT nasal spray 2 spray  2 spray Each Nare Daily Agbata, Tochukwu, MD      . folic acid (FOLVITE) tablet 1 mg  1 mg Oral Daily Agbata, Tochukwu, MD   1 mg at 03/29/20 1733  . heparin ADULT infusion 100 units/mL (25000 units/284mL)  1,100 Units/hr Intravenous Continuous  Concha Se, MD 11 mL/hr at 03/29/20 1538 1,100 Units/hr at 03/29/20 1538  . LORazepam (ATIVAN) tablet 1-4 mg  1-4 mg Oral Q1H PRN Lucile Shutters, MD       Or  . LORazepam (ATIVAN) injection 1-4 mg  1-4 mg Intravenous Q1H PRN Agbata, Tochukwu, MD      . multivitamin with minerals tablet 1 tablet  1 tablet Oral Daily Agbata, Tochukwu, MD   1 tablet at 03/29/20 1733  . nicotine (NICODERM CQ - dosed in mg/24 hours) patch 21 mg  21 mg Transdermal Daily Agbata, Tochukwu, MD   21 mg at 03/29/20 1733  . ondansetron (ZOFRAN) tablet 4 mg  4 mg Oral Q6H PRN Agbata, Tochukwu, MD       Or  . ondansetron (ZOFRAN) injection 4 mg  4 mg Intravenous Q6H PRN Agbata, Tochukwu, MD      . sodium chloride flush (NS) 0.9 % injection 3 mL  3 mL Intravenous Q12H Agbata, Tochukwu, MD      . sodium chloride flush (NS) 0.9 % injection 3 mL  3 mL Intravenous PRN Agbata, Tochukwu, MD      . sodium chloride flush (NS) 0.9 % injection 3 mL  3 mL Intravenous Q12H Agbata, Tochukwu, MD   3 mL at 03/29/20 1734  . sodium chloride flush (NS) 0.9 % injection 3 mL  3 mL Intravenous PRN Agbata, Tochukwu, MD      . thiamine tablet 100 mg  100 mg Oral Daily Agbata, Tochukwu, MD   100 mg at 03/29/20 1733   Or  . thiamine (B-1) injection 100 mg  100 mg Intravenous Daily Agbata, Tochukwu, MD        Past Medical History:  Diagnosis Date  . Alcohol abuse    a. reported seizure-like activity   . Tobacco abuse     History reviewed. No pertinent surgical history.  Social History Social History   Tobacco Use  . Smoking status: Current Every Day Smoker    Packs/day: 1.00    Years: 50.00    Pack years: 50.00    Types: Cigarettes  . Smokeless tobacco: Never Used  Substance Use Topics  . Alcohol use: Not Currently    Comment: a.  Previously reports being a heavy drinker though has not drank in 15 years  . Drug use: Yes    Types: Marijuana    Family History Family History  Problem Relation Age of Onset  . Hypertension  Father   . Heart disease Son        a. s/p heart transplant   No family history of bleeding/clotting disorders, porphyria or autoimmune disease   Allergies  Allergen Reactions  . Codeine Itching and Nausea And Vomiting  . Penicillins Hives    Did it involve swelling of the face/tongue/throat, SOB, or low BP? No Did it involve sudden or severe rash/hives, skin peeling, or any reaction on the inside of your mouth or nose? No Did you need to seek medical attention at a hospital or doctor's office? No When did it last happen?childhood If all above answers are "NO", may proceed with cephalosporin use.     REVIEW OF SYSTEMS (Negative unless checked)  Constitutional: [] Weight loss  [] Fever  [] Chills Cardiac: [] Chest pain   [] Chest pressure   [] Palpitations   [] Shortness of breath when laying flat   [] Shortness of breath at rest   [] Shortness of breath with exertion. Vascular:  [] Pain in legs with walking   [x] Pain in legs at rest   [] Pain in legs when laying flat   [] Claudication   [] Pain in feet when walking  [] Pain in feet at rest  [] Pain in feet when laying flat   [] History of DVT   [] Phlebitis   [x] Swelling in legs   [x] Varicose veins   [] Non-healing ulcers Pulmonary:   [] Uses home oxygen   [] Productive cough   [] Hemoptysis   [] Wheeze  [] COPD   [] Asthma Neurologic:  [] Dizziness  [] Blackouts   [] Seizures   [] History of stroke   [] History of TIA  [] Aphasia   [] Temporary blindness   [] Dysphagia   [] Weakness or numbness in arms   [] Weakness or numbness in legs Musculoskeletal:  [] Arthritis   [x] Joint swelling   [] Joint pain   [] Low back pain Hematologic:  [] Easy bruising  [] Easy bleeding   [] Hypercoagulable state   [] Anemic  [] Hepatitis Gastrointestinal:  [] Blood in stool   [] Vomiting blood  [] Gastroesophageal reflux/heartburn   [] Difficulty swallowing. Genitourinary:  [] Chronic kidney disease   [] Difficult urination  [] Frequent urination  [] Burning with urination   [] Blood in urine Skin:   [] Rashes   [] Ulcers   [] Wounds Psychological:  [] History of anxiety   []  History of major depression.    Physical Examination  Vitals:   03/29/20 1542 03/29/20 1622 03/29/20 1700 03/29/20 2040  BP: (!) 138/96 (!) 138/96 (!) 132/92 (!) 105/43  Pulse: 83 83 84 74  Resp: 16  18 16   Temp:   98.5 F (36.9 C) 98.4 F (36.9 C)  TempSrc:      SpO2: 100%  100% 98%  Weight:      Height:       Body mass index is 20.67 kg/m.  Head: Happy/AT, No temporalis wasting. Prominent temp pulse not noted. Ear/Nose/Throat: Nares w/o erythema or drainage, oropharynx w/o obsrtuction, Mallampati score: 3.   Eyes: PERRLA, Sclera nonicteric.  Neck: Supple, no nuchal rigidity.  No bruit or JVD.  Pulmonary:  Breath sounds equal bilaterally, no use of accessory muscles.  Cardiac: RRR, normal S1, S2, no Murmurs, rubs or gallops. Vascular: right leg 3+ edema of the calf minimal edema of the thigh.  2+ DP and PT pulses bilaterally Gastrointestinal: soft, non-tender, non-distended.  Musculoskeletal: Moves all extremities.  No deformity or atrophy. No edema. Neurologic: CN 2-12 intact. Symmetrical.  Speech is fluent.  Psychiatric: Judgment intact, Mood & affect appropriate for pt's clinical situation. Dermatologic: No rashes or ulcers noted.  No cellulitis or open wounds. Lymph : No Cervical,  or Inguinal lymphadenopathy.      CBC Lab Results  Component Value Date   WBC 11.0 (H) 03/29/2020   HGB 15.2 03/29/2020   HCT 45.7 03/29/2020   MCV 91.0 03/29/2020   PLT 211 03/29/2020    BMET    Component Value Date/Time   NA 139 03/29/2020 1157   K 4.5 03/29/2020 1157   CL 104 03/29/2020 1157   CO2 25 03/29/2020 1157   GLUCOSE 97 03/29/2020 1157   BUN 24 (H) 03/29/2020 1157   CREATININE 1.15 03/29/2020 1157   CALCIUM 9.4 03/29/2020 1157   GFRNONAA >60 03/29/2020 1157   GFRAA >60 09/13/2019 1243   Estimated Creatinine Clearance: 52.1 mL/min (by C-G formula based on SCr of 1.15 mg/dL).  COAG Lab  Results  Component Value Date   INR 0.9 03/29/2020    Radiology: Duplex ultrasound is reviewed by me personally and shows changes consistent with acute DVT of the right leg extending to the femoral vein    Assessment/Plan 1.  Acute DVT right leg: I have discussed the possibility of thrombectomy with the patient.  Given the lack of finding at the level of the thigh but the severity of the symptoms in his calf this would likely be helpful but it is not absolutely needed.  He wishes to consider this and so we will initiate heparin gtt and reassess.  If we do not move forward with thrombectomy he can follow up with me in the office next week.     05/29/2020, MD  03/29/2020 8:53 PM

## 2020-03-29 NOTE — ED Notes (Signed)
Pt eating dinner tray at this time 

## 2020-03-29 NOTE — ED Notes (Signed)
Lab contacted to add on labs 

## 2020-03-29 NOTE — ED Triage Notes (Signed)
Arrives from [place] with C/O 5 day history of right leg pain and swelling.  Patient has US done outpatient, per [place] tech, + right leg DVT.  AAOx3.  Skin warm and dry. AND

## 2020-03-29 NOTE — H&P (Signed)
History and Physical    Peter Galvan OZD:[id number] DOB: 03-May-1945 DOA: 03/29/2020  PCP: Patient, No Pcp Per   Patient coming from: Home  I have personally briefly reviewed patient's old medical records in [facility]  Chief Complaint: Right leg numbness/pain  HPI: Peter Galvan is a 75 y.o. male with medical history significant for nicotine dependence and history of alcohol abuse who presents to the emergency room for evaluation of a 5-day history of numbness involving his right leg as well as pain and swelling in the right calf.  He had kept his leg elevated at home without any significant improvement in the numbness or pain.  He rates his pain a 6 x 10 in intensity at its worst and it is nonradiating.  He denies having any falls or any recent trauma.  [facility] and was sent for an ultrasound of his right lower extremity.  Patient states that he was called and told that he had a blood clot and he needed to go to the ER for evaluation. He denies having any chest pain, no shortness of breath, no fever, no chills, no cough, no palpitations, no diaphoresis, no weight loss, no anorexia, no urinary frequency, no nocturia, no dysuria, no dizziness, no lightheadedness, no syncopal episodes, no urinary frequency, no nocturia, no dysuria. Labs show sodium 139, potassium 4.5, chloride 104, bicarb 25, glucose 97, BUN 24, creatinine 1.15, calcium 9.4, alkaline phosphatase 86, albumin 4.3, AST 19, ALT 17, total protein 7.8, total bilirubin 1.0, white count 11.0, hemoglobin 15.0, hematocrit 45.7, MCV 91.0, RDW 13.2, platelet count 211, PT 12.2, INR 0.9 Respiratory viral panel is pending Right lower extremity venous Doppler is positive for occlusive deep venous thrombosis involving the right femoral vein, popliteal vein, posterior tibial vein, and peroneal vein. Twelve-lead EKG reviewed by me shows sinus rhythm with PVCs  ED Course: Patient is a 75 year old  Caucasian male who presents to the ER for evaluation of numbness and pain involving his right lower extremity.  He had a right lower extremity venous Doppler done which showed extensive DVT involving the right femoral vein, popliteal vein, posterior tibial vein and peroneal vein.  Patient was referred to the emergency room from the orthopedic office for further evaluation.  The emergency room provider discussed with vascular surgery who plans on doing a thrombectomy.  Patient was started on a heparin drip and will be admitted to the hospital for further evaluation.   Review of Systems: As per HPI otherwise all other systems reviewed and negative.    Past Medical History:  Diagnosis Date  . Alcohol abuse    a. reported seizure-like activity   . Tobacco abuse     History reviewed. No pertinent surgical history.   reports that he has been smoking cigarettes. He has a 50.00 pack-year smoking history. He has never used smokeless tobacco. He reports previous alcohol use. He reports current drug use. Drug: Marijuana.  Allergies  Allergen Reactions  . Codeine Itching and Nausea And Vomiting  . Penicillins Hives    Did it involve swelling of the face/tongue/throat, SOB, or low BP? No Did it involve sudden or severe rash/hives, skin peeling, or any reaction on the inside of your mouth or nose? No Did you need to seek medical attention at a hospital or doctor's office? No When did it last happen?childhood If all above answers are "NO", may proceed with cephalosporin use.    Family History  Problem Relation  Age of Onset  . Hypertension Father   . Heart disease Son        a. s/p heart transplant       Prior to Admission medications   Medication Sig Start Date End Date Taking? Authorizing Provider  atorvastatin (LIPITOR) 40 MG tablet Take 1 tablet (40 mg total) by mouth daily. 05/19/18 05/19/19  Ihor Austin, MD  fluticasone (FLONASE) 50 MCG/ACT nasal spray Place 2 sprays into both  nostrils daily. 09/13/19 09/12/20  Tommi Rumps, PA-C    Physical Exam: Vitals:   03/29/20 1155 03/29/20 1157 03/29/20 1304  BP:  (!) 150/103 (!) 131/54  Pulse:  89 90  Resp:  (!) 22 16  Temp:  98.3 F (36.8 C)   TempSrc:  Oral   SpO2:  100% 98%  Weight: 63.5 kg    Height: 5\' 9"  (1.753 m)       Vitals:   03/29/20 1155 03/29/20 1157 03/29/20 1304  BP:  (!) 150/103 (!) 131/54  Pulse:  89 90  Resp:  (!) 22 16  Temp:  98.3 F (36.8 C)   TempSrc:  Oral   SpO2:  100% 98%  Weight: 63.5 kg    Height: 5\' 9"  (1.753 m)        Constitutional: Alert and oriented x 3 . Not in any apparent distress.  Hard of hearing HEENT:      Head: Normocephalic and atraumatic.         Eyes: PERLA, EOMI, Conjunctivae are normal. Sclera is non-icteric.       Mouth/Throat: Mucous membranes are moist.       Neck: Supple with no signs of meningismus. Cardiovascular: Regular rate and rhythm. No murmurs, gallops, or rubs. 2+ symmetrical distal pulses are present . No JVD. No LE edema Respiratory: Respiratory effort normal .Lungs sounds clear bilaterally. No wheezes, crackles, or rhonchi.  Gastrointestinal: Soft, non tender*=, and non distended with positive bowel sounds.  Genitourinary: No CVA tenderness. Musculoskeletal:  Right lower extremity swollen compared to the left lower extremity. No cyanosis, or erythema of extremities. Neurologic:  Face is symmetric. Moving all extremities. No gross focal neurologic deficits  Skin: Skin is warm, dry.  No rash or ulcers Psychiatric: Mood and affect are normal   Labs on Admission: I have personally reviewed following labs and imaging studies  CBC: Recent Labs  Lab 03/29/20 1157  WBC 11.0*  NEUTROABS 7.8*  HGB 15.2  HCT 45.7  MCV 91.0  PLT 211   Basic Metabolic Panel: Recent Labs  Lab 03/29/20 1157  NA 139  K 4.5  CL 104  CO2 25  GLUCOSE 97  BUN 24*  CREATININE 1.15  CALCIUM 9.4   GFR: Estimated Creatinine Clearance: 52.1 mL/min (by  C-G formula based on SCr of 1.15 mg/dL). Liver Function Tests: Recent Labs  Lab 03/29/20 1157  AST 19  ALT 17  ALKPHOS 86  BILITOT 1.0  PROT 7.8  ALBUMIN 4.3   No results for input(s): LIPASE, AMYLASE in the last 168 hours. No results for input(s): AMMONIA in the last 168 hours. Coagulation Profile: Recent Labs  Lab 03/29/20 1157  INR 0.9   Cardiac Enzymes: No results for input(s): CKTOTAL, CKMB, CKMBINDEX, TROPONINI in the last 168 hours. BNP (last 3 results) No results for input(s): PROBNP in the last 8760 hours. HbA1C: No results for input(s): HGBA1C in the last 72 hours. CBG: No results for input(s): GLUCAP in the last 168 hours. Lipid Profile: No results for input(s):  CHOL, HDL, LDLCALC, TRIG, CHOLHDL, LDLDIRECT in the last 72 hours. Thyroid Function Tests: No results for input(s): TSH, T4TOTAL, FREET4, T3FREE, THYROIDAB in the last 72 hours. Anemia Panel: No results for input(s): VITAMINB12, FOLATE, FERRITIN, TIBC, IRON, RETICCTPCT in the last 72 hours. Urine analysis:    Component Value Date/Time   COLORURINE YELLOW (A) 05/18/2018 1422   APPEARANCEUR CLEAR (A) 05/18/2018 1422   LABSPEC 1.013 05/18/2018 1422   PHURINE 7.0 05/18/2018 1422   GLUCOSEU NEGATIVE 05/18/2018 1422   HGBUR NEGATIVE 05/18/2018 1422   BILIRUBINUR NEGATIVE 05/18/2018 1422   KETONESUR NEGATIVE 05/18/2018 1422   PROTEINUR NEGATIVE 05/18/2018 1422   NITRITE NEGATIVE 05/18/2018 1422   LEUKOCYTESUR NEGATIVE 05/18/2018 1422    Radiological Exams on Admission: US Venous Img Lower Unilateral Right (DVT)  Result Date: 03/29/2020 CLINICAL DATA:  Right leg swelling for 5 days EXAM: RIGHT LOWER EXTREMITY VENOUS DOPPLER ULTRASOUND TECHNIQUE: Gray-scale sonography with graded compression, as well as color Doppler and duplex ultrasound were performed to evaluate the lower extremity deep venous systems from the level of the common femoral vein and including the common femoral, femoral, profunda femoral,  popliteal and calf veins including the posterior tibial, peroneal and gastrocnemius veins when visible. The superficial great saphenous vein was also interrogated. Spectral Doppler was utilized to evaluate flow at rest and with distal augmentation maneuvers in the common femoral, femoral and popliteal veins. COMPARISON:  None. FINDINGS: Contralateral Common Femoral Vein: Respiratory phasicity is normal and symmetric with the symptomatic side. No evidence of thrombus. Normal compressibility. Common Femoral Vein: No evidence of thrombus. Normal compressibility, respiratory phasicity and response to augmentation. Saphenofemoral Junction: No evidence of thrombus. Normal compressibility and flow on color Doppler imaging. Profunda Femoral Vein: No evidence of thrombus. Normal compressibility and flow on color Doppler imaging. Femoral Vein: Echogenic occlusive thrombus within the proximal, mid, and distal portions of the right femoral vein. Popliteal Vein: Occlusive popliteal vein thrombus. Calf Veins: Occlusive thrombus is also evident within the posterior tibial and peroneal veins. Superficial Great Saphenous Vein: No evidence of thrombus. Normal compressibility. Venous Reflux:  None. Other Findings:  None. IMPRESSION: Positive for occlusive deep venous thrombosis involving the right femoral vein, popliteal vein, posterior tibial vein, and peroneal vein. These results will be called to the ordering clinician or representative by the Radiologist Assistant, and communication documented in the PACS or [employer]. Electronically Signed   By: Duanne Guess D.O.   On: 03/29/2020 12:00     Assessment/Plan Principal Problem:   DVT (deep venous thrombosis) (HCC) Active Problems:   Alcohol abuse   Tobacco abuse     Extensive right lower extremity DVT Patient presents for evaluation of right leg pain and swelling admitted to have extensive DVT involving the right femoral vein, popliteal vein, posterior tibial  vein and peroneal vein. Continue heparin drip initiated in the ER Vascular surgery consult for possible thrombectomy    Nicotine dependence Smoking cessation was discussed with patient in detail He smokes 1 pack of cigarettes daily We will place patient on a nicotine transdermal patch 21 mg daily    History of alcohol abuse We will place patient on lorazepam and administer for CIWA score of 8 or greater Start patient on thiamine, MVI and folic acid  DVT prophylaxis: Heparin Code Status: full code Family Communication: Greater than 50% of time was spent discussing plan of care with patient at the bedside.  All questions and concerns have been addressed.  He verbalizes understanding and agrees with the  plan. Disposition Plan: Back to previous home environment Consults called: Vascular surgery Status: Inpatient.  The medical decision making for this patient was of high complexity and patient is at high risk for clinical deterioration during this hospitalization.    Lucile Shuttersochukwu Agbata MD [facility]     03/29/2020, 3:17 PM

## 2020-03-29 NOTE — Progress Notes (Signed)
ANTICOAGULATION CONSULT NOTE - Initial Consult  Pharmacy Consult for Heparin drip Indication: DVT  Allergies  Allergen Reactions  . Codeine Itching and Nausea And Vomiting  . Penicillins Hives    Did it involve swelling of the face/tongue/throat, SOB, or low BP? No Did it involve sudden or severe rash/hives, skin peeling, or any reaction on the inside of your mouth or nose? No Did you need to seek medical attention at a hospital or doctor's office? No When did it last happen?childhood If all above answers are "NO", may proceed with cephalosporin use.    Patient Measurements: Height: 5\' 9"  (175.3 cm) Weight: 63.5 kg (140 lb) IBW/kg (Calculated) : 70.7 Heparin Dosing Weight: 63.5  Vital Signs: Temp: 98.3 F (36.8 C) (03/04 1157) Temp Source: Oral (03/04 1157) BP: 138/96 (03/04 1542) Pulse Rate: 83 (03/04 1542)  Labs: Recent Labs    03/29/20 1157  HGB 15.2  HCT 45.7  PLT 211  APTT 29  LABPROT 12.2  INR 0.9  CREATININE 1.15    Estimated Creatinine Clearance: 52.1 mL/min (by C-G formula based on SCr of 1.15 mg/dL).   Medical History: Past Medical History:  Diagnosis Date  . Alcohol abuse    a. reported seizure-like activity   . Tobacco abuse     Medications:  Scheduled:  . fluticasone  2 spray Each Nare Daily  . folic acid  1 mg Oral Daily  . multivitamin with minerals  1 tablet Oral Daily  . nicotine  21 mg Transdermal Daily  . sodium chloride flush  3 mL Intravenous Q12H  . sodium chloride flush  3 mL Intravenous Q12H  . thiamine  100 mg Oral Daily   Or  . thiamine  100 mg Intravenous Daily   Infusions:  . sodium chloride    . sodium chloride    . heparin 1,100 Units/hr (03/29/20 1538)    Assessment: 75 yo M to start Heparin drip for DVT treatment. No Anticoag noted PTA in Med Rec Hgb 15.2  plt 211  INR 0.9  APTT 29   Goal of Therapy:  Heparin level 0.3-0.7 units/ml Monitor platelets by anticoagulation protocol: Yes   Plan:  Give  4400 units bolus x 1 Start heparin infusion at 1100 units/hr Check anti-Xa level in 8 hours and daily while on heparin Continue to monitor H&H and platelets  Ladeja Pelham A 03/29/2020,4:13 PM

## 2020-03-29 NOTE — ED Notes (Signed)
Pt speaking to family on phone at this time

## 2020-03-29 NOTE — ED Notes (Signed)
Heparin bolus checked with Lillia Abed RN

## 2020-03-30 DIAGNOSIS — M160 Bilateral primary osteoarthritis of hip: Secondary | ICD-10-CM | POA: Diagnosis not present

## 2020-03-30 DIAGNOSIS — Z720 Tobacco use: Secondary | ICD-10-CM | POA: Diagnosis not present

## 2020-03-30 DIAGNOSIS — I82491 Acute embolism and thrombosis of other specified deep vein of right lower extremity: Secondary | ICD-10-CM | POA: Diagnosis not present

## 2020-03-30 LAB — BASIC METABOLIC PANEL
Anion gap: 8 (ref 5–15)
BUN: 21 mg/dL (ref 8–23)
CO2: 24 mmol/L (ref 22–32)
Calcium: 9.2 mg/dL (ref 8.9–10.3)
Chloride: 105 mmol/L (ref 98–111)
Creatinine, Ser: 1.03 mg/dL (ref 0.61–1.24)
GFR, Estimated: >60 mL/min (ref >60)
Glucose, Bld: 102 mg/dL — ABNORMAL HIGH (ref 70–99)
Potassium: 4.2 mmol/L (ref 3.5–5.1)
Sodium: 137 mmol/L (ref 135–145)

## 2020-03-30 LAB — CBC
HCT: 38.6 % — ABNORMAL LOW (ref 39.0–52.0)
Hemoglobin: 13.1 g/dL (ref 13.0–17.0)
MCH: 30.9 pg (ref 26.0–34.0)
MCHC: 33.9 g/dL (ref 30.0–36.0)
MCV: 91 fL (ref 80.0–100.0)
Platelets: 197 10*3/uL (ref 150–400)
RBC: 4.24 MIL/uL (ref 4.22–5.81)
RDW: 13.2 % (ref 11.5–15.5)
WBC: 8.9 10*3/uL (ref 4.0–10.5)
nRBC: 0 % (ref 0.0–0.2)

## 2020-03-30 LAB — HEPARIN LEVEL (UNFRACTIONATED): Heparin Unfractionated: 0.49 IU/mL (ref 0.30–0.70)

## 2020-03-30 MED ORDER — TRAZODONE HCL 50 MG PO TABS
50.0000 mg | ORAL_TABLET | Freq: Every evening | ORAL | Status: DC | PRN
  Administered 2020-03-30: 50 mg via ORAL
  Filled 2020-03-30: qty 1

## 2020-03-30 NOTE — Progress Notes (Signed)
PROGRESS NOTE  Peter Galvan TDH:[id number] DOB: 1945-09-05   PCP: Patient, No Pcp Per  Patient is from: Home  DOA: 03/29/2020 LOS: 1  Chief complaints: Right leg pain, swelling and numbness  Brief Narrative / Interim history: 75 year old male with PMH of tobacco use presented to Ortho office with the above chief complaints and had lower extremity ultrasound positive for extensive DVT, and directed to ED for further care.  He had no cardiopulmonary symptoms.  Started on IV heparin.  Vascular surgery consulted.   Subjective: Seen and examined earlier this morning.  No major events overnight of this morning. He hasn't and good sleep.  He says he just could not fall asleep but pain improved.  He denies chest pain, problems breathing, GI or UTI symptoms.  He says he hasn't had alcohol for 15 years.   Objective: Vitals:   03/29/20 1700 03/29/20 2040 03/30/20 0225 03/30/20 0805  BP: (!) 132/92 (!) 105/43 110/74 122/71  Pulse: 84 74 90 75  Resp: 18 16 19 18   Temp: 98.5 F (36.9 C) 98.4 F (36.9 C) 98.5 F (36.9 C) 98 F (36.7 C)  TempSrc:   Oral   SpO2: 100% 98% 98% 99%  Weight:      Height:        Intake/Output Summary (Last 24 hours) at 03/30/2020 1051 Last data filed at 03/30/2020 1036 Gross per 24 hour  Intake 208.63 ml  Output 1100 ml  Net -891.37 ml   Filed Weights   03/29/20 1155  Weight: 63.5 kg    Examination:  GENERAL: No apparent distress.  Nontoxic. HEENT: MMM.  Vision and hearing grossly intact.  NECK: Supple.  No apparent JVD.  RESP:  No IWOB.  Fair aeration bilaterally. CVS:  RRR. Heart sounds normal.  ABD/GI/GU: BS+. Abd soft, NTND.  MSK/EXT:  Moves extremities. No apparent deformity.  RLE calf swelling SKIN: no apparent skin lesion or wound NEURO: Awake, alert and oriented appropriately.  No apparent focal neuro deficit. PSYCH: Calm. Normal affect.   Procedures:  None yet  Microbiology summarized: COVID-19 and influenza PCR  nonreactive.  Assessment & Plan: Unprovoked RLE DVT-no prior history of VTE.  Denies recent travel or immobility.  Never had colonoscopy for age-appropriate cancer screening.  No constitutional symptoms.  He has chronic cough that he attributes to smoking but no hemoptysis.  Has no cardiopulmonary symptoms. -Continue IV heparin -Follow vascular surgery recommendation -Needs age-appropriate cancer screening but he is not interested in colonoscopy.   Tobacco use disorder: Smokes about a pack a day. -Encouraged tobacco cessation -Continue nicotine patch  History of alcohol abuse-he says he has been sober for 15 years after detox at [facility].  CIWA score 0. -Discontinue CIWA and Ativan  Osteoarthritis: Chronic -As needed Tylenol   Body mass index is 20.67 kg/m.         DVT prophylaxis:  On IV heparin for DVT  Code Status: Full code Family Communication: Patient and/or RN. Available if any question.  Level of care: Med-Surg Status is: Inpatient  Remains inpatient appropriate because:Ongoing diagnostic testing needed not appropriate for outpatient work up, IV treatments appropriate due to intensity of illness or inability to take PO and Inpatient level of care appropriate due to severity of illness   Dispo: The patient is from: Home              Anticipated d/c is to: Home              Patient currently is  not medically stable to d/c.   Difficult to place patient No       Consultants:  Vascular surgery   Sch Meds:  Scheduled Meds: . fluticasone  2 spray Each Nare Daily  . folic acid  1 mg Oral Daily  . multivitamin with minerals  1 tablet Oral Daily  . nicotine  21 mg Transdermal Daily  . sodium chloride flush  3 mL Intravenous Q12H  . sodium chloride flush  3 mL Intravenous Q12H  . thiamine  100 mg Oral Daily   Or  . thiamine  100 mg Intravenous Daily   Continuous Infusions: . sodium chloride    . sodium chloride    . heparin 1,100 Units/hr (03/30/20 1039)    PRN Meds:.sodium chloride, sodium chloride, acetaminophen **OR** acetaminophen, LORazepam **OR** LORazepam, ondansetron **OR** ondansetron (ZOFRAN) IV, sodium chloride flush, sodium chloride flush  Antimicrobials: Anti-infectives (From admission, onward)   None       I have personally reviewed the following labs and images: CBC: Recent Labs  Lab 03/29/20 1157 03/30/20 0652  WBC 11.0* 8.9  NEUTROABS 7.8*  --   HGB 15.2 13.1  HCT 45.7 38.6*  MCV 91.0 91.0  PLT 211 197   BMP &GFR Recent Labs  Lab 03/29/20 1157 03/29/20 1516 03/30/20 0652  NA 139  --  137  K 4.5  --  4.2  CL 104  --  105  CO2 25  --  24  GLUCOSE 97  --  102*  BUN 24*  --  21  CREATININE 1.15  --  1.03  CALCIUM 9.4  --  9.2  MG  --  2.4  --   PHOS  --  2.7  --    Estimated Creatinine Clearance: 58.2 mL/min (by C-G formula based on SCr of 1.03 mg/dL). Liver & Pancreas: Recent Labs  Lab 03/29/20 1157  AST 19  ALT 17  ALKPHOS 86  BILITOT 1.0  PROT 7.8  ALBUMIN 4.3   No results for input(s): LIPASE, AMYLASE in the last 168 hours. No results for input(s): AMMONIA in the last 168 hours. Diabetic: No results for input(s): HGBA1C in the last 72 hours. No results for input(s): GLUCAP in the last 168 hours. Cardiac Enzymes: No results for input(s): CKTOTAL, CKMB, CKMBINDEX, TROPONINI in the last 168 hours. No results for input(s): PROBNP in the last 8760 hours. Coagulation Profile: Recent Labs  Lab 03/29/20 1157  INR 0.9   Thyroid Function Tests: No results for input(s): TSH, T4TOTAL, FREET4, T3FREE, THYROIDAB in the last 72 hours. Lipid Profile: No results for input(s): CHOL, HDL, LDLCALC, TRIG, CHOLHDL, LDLDIRECT in the last 72 hours. Anemia Panel: No results for input(s): VITAMINB12, FOLATE, FERRITIN, TIBC, IRON, RETICCTPCT in the last 72 hours. Urine analysis:    Component Value Date/Time   COLORURINE YELLOW (A) 05/18/2018 1422   APPEARANCEUR CLEAR (A) 05/18/2018 1422   LABSPEC  1.013 05/18/2018 1422   PHURINE 7.0 05/18/2018 1422   GLUCOSEU NEGATIVE 05/18/2018 1422   HGBUR NEGATIVE 05/18/2018 1422   BILIRUBINUR NEGATIVE 05/18/2018 1422   KETONESUR NEGATIVE 05/18/2018 1422   PROTEINUR NEGATIVE 05/18/2018 1422   NITRITE NEGATIVE 05/18/2018 1422   LEUKOCYTESUR NEGATIVE 05/18/2018 1422   Sepsis Labs: Invalid input(s): PROCALCITONIN, LACTICIDVEN  Microbiology: Recent Results (from the past 240 hour(s))  Resp Panel by RT-PCR (Flu A&B, Covid) Nasopharyngeal Swab     Status: None   Collection Time: 03/29/20  2:59 PM   Specimen: Nasopharyngeal Swab; Nasopharyngeal(NP) swabs in vial  transport medium  Result Value Ref Range Status   SARS Coronavirus 2 by RT PCR NEGATIVE NEGATIVE Final    Comment: (NOTE) SARS-CoV-2 target nucleic acids are NOT DETECTED.  The SARS-CoV-2 RNA is generally detectable in upper respiratory specimens during the acute phase of infection. The lowest concentration of SARS-CoV-2 viral copies this assay can detect is 138 copies/mL. A negative result does not preclude SARS-Cov-2 infection and should not be used as the sole basis for treatment or other patient management decisions. A negative result may occur with  improper specimen collection/handling, submission of specimen other than nasopharyngeal swab, presence of viral mutation(s) within the areas targeted by this assay, and inadequate number of viral copies(<138 copies/mL). A negative result must be combined with clinical observations, patient history, and epidemiological information. The expected result is Negative.  Fact Sheet for Patients:  BloggerCourse.com  Fact Sheet for Healthcare Providers:  SeriousBroker.it  This test is no t yet approved or cleared by the [place] FDA and  has been authorized for detection and/or diagnosis of SARS-CoV-2 by FDA under an Emergency Use Authorization (EUA). This EUA will remain  in effect  (meaning this test can be used) for the duration of the COVID-19 declaration under Section 564(b)(1) of the Act, 21 U.S.C.section 360bbb-3(b)(1), unless the authorization is terminated  or revoked sooner.       Influenza A by PCR NEGATIVE NEGATIVE Final   Influenza B by PCR NEGATIVE NEGATIVE Final    Comment: (NOTE) The Xpert Xpress SARS-CoV-2/FLU/RSV plus assay is intended as an aid in the diagnosis of influenza from Nasopharyngeal swab specimens and should not be used as a sole basis for treatment. Nasal washings and aspirates are unacceptable for Xpert Xpress SARS-CoV-2/FLU/RSV testing.  Fact Sheet for Patients: BloggerCourse.com  Fact Sheet for Healthcare Providers: SeriousBroker.it  This test is not yet approved or cleared by the [place] FDA and has been authorized for detection and/or diagnosis of SARS-CoV-2 by FDA under an Emergency Use Authorization (EUA). This EUA will remain in effect (meaning this test can be used) for the duration of the COVID-19 declaration under Section 564(b)(1) of the Act, 21 U.S.C. section 360bbb-3(b)(1), unless the authorization is terminated or revoked.  Performed at [facility], [address], [place], [place] [address]     Radiology Studies: US Venous Img Lower Unilateral Right (DVT)  Result Date: 03/29/2020 CLINICAL DATA:  Right leg swelling for 5 days EXAM: RIGHT LOWER EXTREMITY VENOUS DOPPLER ULTRASOUND TECHNIQUE: Gray-scale sonography with graded compression, as well as color Doppler and duplex ultrasound were performed to evaluate the lower extremity deep venous systems from the level of the common femoral vein and including the common femoral, femoral, profunda femoral, popliteal and calf veins including the posterior tibial, peroneal and gastrocnemius veins when visible. The superficial great saphenous vein was also interrogated. Spectral Doppler was utilized to  evaluate flow at rest and with distal augmentation maneuvers in the common femoral, femoral and popliteal veins. COMPARISON:  None. FINDINGS: Contralateral Common Femoral Vein: Respiratory phasicity is normal and symmetric with the symptomatic side. No evidence of thrombus. Normal compressibility. Common Femoral Vein: No evidence of thrombus. Normal compressibility, respiratory phasicity and response to augmentation. Saphenofemoral Junction: No evidence of thrombus. Normal compressibility and flow on color Doppler imaging. Profunda Femoral Vein: No evidence of thrombus. Normal compressibility and flow on color Doppler imaging. Femoral Vein: Echogenic occlusive thrombus within the proximal, mid, and distal portions of the right femoral vein. Popliteal Vein: Occlusive popliteal vein thrombus.  Calf Veins: Occlusive thrombus is also evident within the posterior tibial and peroneal veins. Superficial Great Saphenous Vein: No evidence of thrombus. Normal compressibility. Venous Reflux:  None. Other Findings:  None. IMPRESSION: Positive for occlusive deep venous thrombosis involving the right femoral vein, popliteal vein, posterior tibial vein, and peroneal vein. These results will be called to the ordering clinician or representative by the Radiologist Assistant, and communication documented in the PACS or [employer]. Electronically Signed   By: Duanne Guess D.O.   On: 03/29/2020 12:00      Taye T. Gonfa [facility]  If 7PM-7AM, please contact night-coverage [email or web address] 03/30/2020, 10:51 AM

## 2020-03-30 NOTE — Progress Notes (Signed)
ANTICOAGULATION CONSULT NOTE - Initial Consult  Pharmacy Consult for Heparin drip Indication: DVT  Allergies  Allergen Reactions  . Codeine Itching and Nausea And Vomiting  . Penicillins Hives    Did it involve swelling of the face/tongue/throat, SOB, or low BP? No Did it involve sudden or severe rash/hives, skin peeling, or any reaction on the inside of your mouth or nose? No Did you need to seek medical attention at a hospital or doctor's office? No When did it last happen?childhood If all above answers are "NO", may proceed with cephalosporin use.    Patient Measurements: Height: 5\' 9"  (175.3 cm) Weight: 63.5 kg (140 lb) IBW/kg (Calculated) : 70.7 Heparin Dosing Weight: 63.5  Vital Signs: Temp: 98.4 F (36.9 C) (03/04 2040) BP: 105/43 (03/04 2040) Pulse Rate: 74 (03/04 2040)  Labs: Recent Labs    03/29/20 1157 03/29/20 2247  HGB 15.2  --   HCT 45.7  --   PLT 211  --   APTT 29  --   LABPROT 12.2  --   INR 0.9  --   HEPARINUNFRC  --  0.65  CREATININE 1.15  --     Estimated Creatinine Clearance: 52.1 mL/min (by C-G formula based on SCr of 1.15 mg/dL).   Medical History: Past Medical History:  Diagnosis Date  . Alcohol abuse    a. reported seizure-like activity   . Tobacco abuse     Medications:  Scheduled:  . fluticasone  2 spray Each Nare Daily  . folic acid  1 mg Oral Daily  . multivitamin with minerals  1 tablet Oral Daily  . nicotine  21 mg Transdermal Daily  . sodium chloride flush  3 mL Intravenous Q12H  . sodium chloride flush  3 mL Intravenous Q12H  . thiamine  100 mg Oral Daily   Or  . thiamine  100 mg Intravenous Daily   Infusions:  . sodium chloride    . sodium chloride    . heparin 1,100 Units/hr (03/29/20 1538)    Assessment: 75 yo M to start Heparin drip for DVT treatment. No Anticoag noted PTA in Med Rec Hgb 15.2  plt 211  INR 0.9  APTT 29   Goal of Therapy:  Heparin level 0.3-0.7 units/ml Monitor platelets by  anticoagulation protocol: Yes   Plan:  3/4:  HL @ 2247 = 0.65 Will continue pt on current rate and recheck HL in 8 hrs on 3/5 @ 0700.   Colletta Spillers D 03/30/2020,12:02 AM

## 2020-03-30 NOTE — Progress Notes (Signed)
ANTICOAGULATION CONSULT NOTE - Initial Consult  Pharmacy Consult for Heparin drip Indication: DVT  Allergies  Allergen Reactions  . Codeine Itching and Nausea And Vomiting  . Penicillins Hives    Did it involve swelling of the face/tongue/throat, SOB, or low BP? No Did it involve sudden or severe rash/hives, skin peeling, or any reaction on the inside of your mouth or nose? No Did you need to seek medical attention at a hospital or doctor's office? No When did it last happen?childhood If all above answers are "NO", may proceed with cephalosporin use.    Patient Measurements: Height: 5\' 9"  (175.3 cm) Weight: 63.5 kg (140 lb) IBW/kg (Calculated) : 70.7 Heparin Dosing Weight: 63.5  Vital Signs: Temp: 98 F (36.7 C) (03/05 0805) Temp Source: Oral (03/05 0225) BP: 122/71 (03/05 0805) Pulse Rate: 75 (03/05 0805)  Labs: Recent Labs    03/29/20 1157 03/29/20 2247 03/30/20 0652  HGB 15.2  --  13.1  HCT 45.7  --  38.6*  PLT 211  --  197  APTT 29  --   --   LABPROT 12.2  --   --   INR 0.9  --   --   HEPARINUNFRC  --  0.65 0.49  CREATININE 1.15  --  1.03    Estimated Creatinine Clearance: 58.2 mL/min (by C-G formula based on SCr of 1.03 mg/dL).   Medical History: Past Medical History:  Diagnosis Date  . Alcohol abuse    a. reported seizure-like activity   . Tobacco abuse     Medications:  Scheduled:  . fluticasone  2 spray Each Nare Daily  . folic acid  1 mg Oral Daily  . multivitamin with minerals  1 tablet Oral Daily  . nicotine  21 mg Transdermal Daily  . sodium chloride flush  3 mL Intravenous Q12H  . sodium chloride flush  3 mL Intravenous Q12H  . thiamine  100 mg Oral Daily   Or  . thiamine  100 mg Intravenous Daily   Infusions:  . sodium chloride    . sodium chloride    . heparin 1,100 Units/hr (03/29/20 1538)    Assessment: 75 yo M to start Heparin drip for DVT treatment. No Anticoag noted PTA in Med Rec Hgb 15.2  plt 211  INR 0.9  APTT  29  3/4:  HL @ 2247 = 0.65 Will continue pt on current rate 3/5:  HL @ 0652 = 0.49  Therapeutic x2   Goal of Therapy:  Heparin level 0.3-0.7 units/ml Monitor platelets by anticoagulation protocol: Yes   Plan:  3/5:  HL @ 0652 = 0.49  Therapeutic x2 Will continue pt on current rate and check HL with am labs.  Netasha Wehrli A 03/30/2020,9:31 AM

## 2020-03-31 DIAGNOSIS — R2681 Unsteadiness on feet: Secondary | ICD-10-CM

## 2020-03-31 DIAGNOSIS — Z720 Tobacco use: Secondary | ICD-10-CM | POA: Diagnosis not present

## 2020-03-31 DIAGNOSIS — I82491 Acute embolism and thrombosis of other specified deep vein of right lower extremity: Secondary | ICD-10-CM

## 2020-03-31 DIAGNOSIS — M160 Bilateral primary osteoarthritis of hip: Secondary | ICD-10-CM | POA: Diagnosis not present

## 2020-03-31 LAB — CBC
HCT: 38.2 % — ABNORMAL LOW (ref 39.0–52.0)
Hemoglobin: 13 g/dL (ref 13.0–17.0)
MCH: 30.8 pg (ref 26.0–34.0)
MCHC: 34 g/dL (ref 30.0–36.0)
MCV: 90.5 fL (ref 80.0–100.0)
Platelets: 214 10*3/uL (ref 150–400)
RBC: 4.22 MIL/uL (ref 4.22–5.81)
RDW: 13.2 % (ref 11.5–15.5)
WBC: 8.2 10*3/uL (ref 4.0–10.5)
nRBC: 0 % (ref 0.0–0.2)

## 2020-03-31 LAB — HEPARIN LEVEL (UNFRACTIONATED): Heparin Unfractionated: 0.4 IU/mL (ref 0.30–0.70)

## 2020-03-31 MED ORDER — RIVAROXABAN (XARELTO) VTE STARTER PACK (15 & 20 MG): ORAL_TABLET | ORAL | 0 refills | Status: DC

## 2020-03-31 MED ORDER — TRAMADOL HCL 50 MG PO TABS
50.0000 mg | ORAL_TABLET | Freq: Two times a day (BID) | ORAL | Status: DC | PRN
  Administered 2020-03-31: 50 mg via ORAL
  Filled 2020-03-31: qty 1

## 2020-03-31 MED ORDER — RIVAROXABAN 15 MG PO TABS
15.0000 mg | ORAL_TABLET | Freq: Two times a day (BID) | ORAL | Status: DC
  Administered 2020-03-31: 09:00:00 15 mg via ORAL
  Filled 2020-03-31 (×2): qty 1

## 2020-03-31 MED ORDER — NICOTINE 21 MG/24HR TD PT24: 21.0000 mg | MEDICATED_PATCH | Freq: Every day | TRANSDERMAL | 0 refills | Status: DC

## 2020-03-31 MED ORDER — RIVAROXABAN 20 MG PO TABS: 20.0000 mg | ORAL_TABLET | Freq: Every day | ORAL | Status: DC

## 2020-03-31 MED ORDER — TRAMADOL HCL 50 MG PO TABS: 50.0000 mg | ORAL_TABLET | Freq: Three times a day (TID) | ORAL | 0 refills | Status: AC | PRN

## 2020-03-31 MED ORDER — RIVAROXABAN 20 MG PO TABS: 20.0000 mg | ORAL_TABLET | Freq: Every day | ORAL | 5 refills | Status: DC

## 2020-03-31 NOTE — Progress Notes (Signed)
ANTICOAGULATION CONSULT NOTE - Initial Consult  Pharmacy Consult for Heparin drip Indication: DVT  Allergies  Allergen Reactions  . Codeine Itching and Nausea And Vomiting  . Penicillins Hives    Did it involve swelling of the face/tongue/throat, SOB, or low BP? No Did it involve sudden or severe rash/hives, skin peeling, or any reaction on the inside of your mouth or nose? No Did you need to seek medical attention at a hospital or doctor's office? No When did it last happen?childhood If all above answers are "NO", may proceed with cephalosporin use.    Patient Measurements: Height: 5\' 9"  (175.3 cm) Weight: 63.5 kg (140 lb) IBW/kg (Calculated) : 70.7 Heparin Dosing Weight: 63.5  Vital Signs: Temp: 97.7 F (36.5 C) (03/06 0359) BP: 118/71 (03/06 0359) Pulse Rate: 77 (03/06 0359)  Labs: Recent Labs    03/29/20 1157 03/29/20 2247 03/30/20 0652 03/31/20 0523  HGB 15.2  --  13.1 13.0  HCT 45.7  --  38.6* 38.2*  PLT 211  --  197 214  APTT 29  --   --   --   LABPROT 12.2  --   --   --   INR 0.9  --   --   --   HEPARINUNFRC  --  0.65 0.49 0.40  CREATININE 1.15  --  1.03  --     Estimated Creatinine Clearance: 58.2 mL/min (by C-G formula based on SCr of 1.03 mg/dL).   Medical History: Past Medical History:  Diagnosis Date  . Alcohol abuse    a. reported seizure-like activity   . Tobacco abuse     Medications:  Scheduled:  . fluticasone  2 spray Each Nare Daily  . folic acid  1 mg Oral Daily  . multivitamin with minerals  1 tablet Oral Daily  . nicotine  21 mg Transdermal Daily  . sodium chloride flush  3 mL Intravenous Q12H  . sodium chloride flush  3 mL Intravenous Q12H  . thiamine  100 mg Oral Daily   Or  . thiamine  100 mg Intravenous Daily   Infusions:  . sodium chloride    . sodium chloride    . heparin 1,100 Units/hr (03/30/20 1039)    Assessment: 75 yo M to start Heparin drip for DVT treatment. No Anticoag noted PTA in Med Rec Hgb 15.2   plt 211  INR 0.9  APTT 29   Goal of Therapy:  Heparin level 0.3-0.7 units/ml Monitor platelets by anticoagulation protocol: Yes   Plan:  3/6:  HL @ 0523 = 0.4, therapeutic X 3 Will continue pt on current rate and recheck HL on 3/7 with AM labs.   Brandilyn Nanninga D 03/31/2020,6:33 AM

## 2020-03-31 NOTE — Discharge Summary (Signed)
Physician Discharge Summary  Peter Galvan YQM:[id number] DOB: 1945/10/12 DOA: 03/29/2020  PCP: Patient, No Pcp Per  Admit date: 03/29/2020 Discharge date: 03/31/2020  Admitted From: Home Disposition: Home  Recommendations for Outpatient Follow-up:  1. Patient encouraged to establish follow-up with PCP.  TOC to assist with this. 2. Patient benefits from age-appropriate screening for cancer. 3. Please obtain CBC/BMP/Mag at follow up 4. Please follow up on the following pending results: None  Home Health: HH PT Equipment/Devices: Rolling walker  Discharge Condition: Stable CODE STATUS: Full code   Follow-up Information    Schnier, Latina Craver, MD. Schedule an appointment as soon as possible for a visit in 1 week(s).   Specialties: Vascular Surgery, Cardiology, Radiology, Vascular Surgery Contact information: [address] [place] [address] [phone]              Hospital Course: 75 year old male with PMH of tobacco use presented to Ortho office with the above chief complaints and had lower extremity ultrasound positive for extensive DVT, and directed to ED for further care.  He had no cardiopulmonary symptoms.  Started on IV heparin.  Vascular surgery, Dr. Gilda Crease saw patient on admission but not available over the weekend for recommendations.    On 03/30/2020, case discussed with vascular surgery on-call, Dr. Cory Roughen who didn't think thrombectomy was indicated and recommended discharge on DOAC.   On the day of discharge, patient's pain and swelling improved.  He was transitioned to Xarelto and discharged on a starter pack.  He was also given prescription for maintained on Xarelto after starter pack.  He was also encouraged to quit smoking cigarettes.  He was provided with resources including quit line phone number.  Patient appears to be motivated to quit smoking cigarettes.  Home health PT and rolling walker ordered as recommended by therapy.  See individual  problem list below for more hospital course.  Discharge Diagnoses:  Unprovoked RLE DVT-no prior history of VTE.  Denies recent travel or immobility.  Never had colonoscopy for age-appropriate cancer screening.  No constitutional symptoms.  He has chronic cough that he attributes to smoking but no hemoptysis.  Has no cardiopulmonary symptoms. -Discharged on starter pack Xarelto -Outpatient follow-up with vascular surgery -Needs age-appropriate cancer screening but he is not interested in colonoscopy.   Tobacco use disorder: Smokes about a pack a day. -Encouraged tobacco cessation.  Provided with resources including quit line number. -Continue nicotine patch  History of alcohol abuse-he says he has been sober for 15 years after detox at [facility].  CIWA score 0. -Discontinue CIWA and Ativan  Osteoarthritis/unsteady gait/pain with ambulation: -As needed Tylenol.  Also gave Rx for tramadol for 3 days. -Advised to avoid NSAIDs while on Xarelto.   Body mass index is 20.67 kg/m.            Discharge Exam: Vitals:   03/31/20 0730 03/31/20 1144  BP: 120/82 124/82  Pulse: 71 85  Resp: 16 18  Temp: 98.9 F (37.2 C) 97.7 F (36.5 C)  SpO2: 100% 95%    GENERAL: No apparent distress.  Nontoxic. HEENT: MMM.  Vision and hearing grossly intact.  NECK: Supple.  No apparent JVD.  RESP:  No IWOB.  Fair aeration bilaterally. CVS:  RRR. Heart sounds normal.  ABD/GI/GU: Bowel sounds present. Soft. Non tender.  MSK/EXT:  Moves extremities. No apparent deformity. No edema.  SKIN: no apparent skin lesion or wound NEURO: Awake, alert and oriented appropriately.  No apparent focal neuro deficit. PSYCH: Calm. Normal  affect.  Discharge Instructions  Discharge Instructions    Call MD for:  difficulty breathing, headache or visual disturbances   Complete by: As directed    Call MD for:  persistant dizziness or light-headedness   Complete by: As directed    Call MD for:  temperature >100.4    Complete by: As directed    Diet general   Complete by: As directed    Discharge instructions   Complete by: As directed    It has been a pleasure taking care of you!  You were hospitalized due to blood clot in your right leg.  You have been treated with blood thinner.  We are discharging you on more blood thinner to continue treatment.  We also recommend you establish care with primary care doctor for follow-up.  Please avoid any over-the-counter pain medication other than plain Tylenol while taking this blood thinner.  It is important that you quit smoking cigarettes.  You may use nicotine patch to help you quit smoking.  Nicotine patch is available over-the-counter.  You may also discuss other options to help you quit smoking with your primary care doctor. You can also talk to professional counselors at 1-800-QUIT-NOW [phone]) for free smoking cessation counseling.    Take care,   Increase activity slowly   Complete by: As directed      Allergies as of 03/31/2020      Reactions   Codeine Itching, Nausea And Vomiting   Penicillins Hives   Did it involve swelling of the face/tongue/throat, SOB, or low BP? No Did it involve sudden or severe rash/hives, skin peeling, or any reaction on the inside of your mouth or nose? No Did you need to seek medical attention at a hospital or doctor's office? No When did it last happen?childhood If all above answers are "NO", may proceed with cephalosporin use.      Medication List    TAKE these medications   nicotine 21 mg/24hr patch Commonly known as: NICODERM CQ - dosed in mg/24 hours Place 1 patch (21 mg total) onto the skin daily. Start taking on: April 01, 2020   Rivaroxaban Stater Pack (15 mg and 20 mg) Commonly known as: XARELTO STARTER PACK Follow package directions: Take one 15mg  tablet by mouth twice a day. On day 22, switch to one 20mg  tablet once a day. Take with food.   rivaroxaban 20 MG Tabs tablet Commonly known  as: XARELTO Take 1 tablet (20 mg total) by mouth daily with supper. Start after you finish the starter pack Start taking on: May 01, 2020   traMADol 50 MG tablet Commonly known as: ULTRAM Take 1 tablet (50 mg total) by mouth every 8 (eight) hours as needed for up to 3 days for moderate pain or severe pain.            Durable Medical Equipment  (From admission, onward)         Start     Ordered   03/31/20 1244  For home use only DME Walker rolling  Once       Question Answer Comment  Walker: With 5 Inch Wheels   Patient needs a walker to treat with the following condition Unsteady gait      03/31/20 1243          Consultations:  Vascular surgery  Procedures/Studies:   05/31/20 Venous Img Lower Unilateral Right (DVT)  Result Date: 03/29/2020 CLINICAL DATA:  Right leg swelling for 5 days EXAM: RIGHT LOWER EXTREMITY  VENOUS DOPPLER ULTRASOUND TECHNIQUE: Gray-scale sonography with graded compression, as well as color Doppler and duplex ultrasound were performed to evaluate the lower extremity deep venous systems from the level of the common femoral vein and including the common femoral, femoral, profunda femoral, popliteal and calf veins including the posterior tibial, peroneal and gastrocnemius veins when visible. The superficial great saphenous vein was also interrogated. Spectral Doppler was utilized to evaluate flow at rest and with distal augmentation maneuvers in the common femoral, femoral and popliteal veins. COMPARISON:  None. FINDINGS: Contralateral Common Femoral Vein: Respiratory phasicity is normal and symmetric with the symptomatic side. No evidence of thrombus. Normal compressibility. Common Femoral Vein: No evidence of thrombus. Normal compressibility, respiratory phasicity and response to augmentation. Saphenofemoral Junction: No evidence of thrombus. Normal compressibility and flow on color Doppler imaging. Profunda Femoral Vein: No evidence of thrombus. Normal  compressibility and flow on color Doppler imaging. Femoral Vein: Echogenic occlusive thrombus within the proximal, mid, and distal portions of the right femoral vein. Popliteal Vein: Occlusive popliteal vein thrombus. Calf Veins: Occlusive thrombus is also evident within the posterior tibial and peroneal veins. Superficial Great Saphenous Vein: No evidence of thrombus. Normal compressibility. Venous Reflux:  None. Other Findings:  None. IMPRESSION: Positive for occlusive deep venous thrombosis involving the right femoral vein, popliteal vein, posterior tibial vein, and peroneal vein. These results will be called to the ordering clinician or representative by the Radiologist Assistant, and communication documented in the PACS or [employer] Dashboard. Electronically Signed   By: Duanne GuessNicholas  Plundo D.O.   On: 03/29/2020 12:00       The results of significant diagnostics from this hospitalization (including imaging, microbiology, ancillary and laboratory) are listed below for reference.     Microbiology: Recent Results (from the past 240 hour(s))  Resp Panel by RT-PCR (Flu A&B, Covid) Nasopharyngeal Swab     Status: None   Collection Time: 03/29/20  2:59 PM   Specimen: Nasopharyngeal Swab; Nasopharyngeal(NP) swabs in vial transport medium  Result Value Ref Range Status   SARS Coronavirus 2 by RT PCR NEGATIVE NEGATIVE Final    Comment: (NOTE) SARS-CoV-2 target nucleic acids are NOT DETECTED.  The SARS-CoV-2 RNA is generally detectable in upper respiratory specimens during the acute phase of infection. The lowest concentration of SARS-CoV-2 viral copies this assay can detect is 138 copies/mL. A negative result does not preclude SARS-Cov-2 infection and should not be used as the sole basis for treatment or other patient management decisions. A negative result may occur with  improper specimen collection/handling, submission of specimen other than nasopharyngeal swab, presence of viral mutation(s) within  the areas targeted by this assay, and inadequate number of viral copies(<138 copies/mL). A negative result must be combined with clinical observations, patient history, and epidemiological information. The expected result is Negative.  Fact Sheet for Patients:  BloggerCourse.comhttps://www.fda.gov/media/152166/download  Fact Sheet for Healthcare Providers:  SeriousBroker.ithttps://www.fda.gov/media/152162/download  This test is no t yet approved or cleared by the [place] FDA and  has been authorized for detection and/or diagnosis of SARS-CoV-2 by FDA under an Emergency Use Authorization (EUA). This EUA will remain  in effect (meaning this test can be used) for the duration of the COVID-19 declaration under Section 564(b)(1) of the Act, 21 U.S.C.section 360bbb-3(b)(1), unless the authorization is terminated  or revoked sooner.       Influenza A by PCR NEGATIVE NEGATIVE Final   Influenza B by PCR NEGATIVE NEGATIVE Final    Comment: (NOTE) The Xpert Xpress SARS-CoV-2/FLU/RSV plus assay  is intended as an aid in the diagnosis of influenza from Nasopharyngeal swab specimens and should not be used as a sole basis for treatment. Nasal washings and aspirates are unacceptable for Xpert Xpress SARS-CoV-2/FLU/RSV testing.  Fact Sheet for Patients: BloggerCourse.com  Fact Sheet for Healthcare Providers: SeriousBroker.it  This test is not yet approved or cleared by the [place] FDA and has been authorized for detection and/or diagnosis of SARS-CoV-2 by FDA under an Emergency Use Authorization (EUA). This EUA will remain in effect (meaning this test can be used) for the duration of the COVID-19 declaration under Section 564(b)(1) of the Act, 21 U.S.C. section 360bbb-3(b)(1), unless the authorization is terminated or revoked.  Performed at [facility], [address] [place], [place] [address]      Labs:  CBC: Recent Labs  Lab  03/29/20 1157 03/30/20 0652 03/31/20 0523  WBC 11.0* 8.9 8.2  NEUTROABS 7.8*  --   --   HGB 15.2 13.1 13.0  HCT 45.7 38.6* 38.2*  MCV 91.0 91.0 90.5  PLT 211 197 214   BMP &GFR Recent Labs  Lab 03/29/20 1157 03/29/20 1516 03/30/20 0652  NA 139  --  137  K 4.5  --  4.2  CL 104  --  105  CO2 25  --  24  GLUCOSE 97  --  102*  BUN 24*  --  21  CREATININE 1.15  --  1.03  CALCIUM 9.4  --  9.2  MG  --  2.4  --   PHOS  --  2.7  --    Estimated Creatinine Clearance: 58.2 mL/min (by C-G formula based on SCr of 1.03 mg/dL). Liver & Pancreas: Recent Labs  Lab 03/29/20 1157  AST 19  ALT 17  ALKPHOS 86  BILITOT 1.0  PROT 7.8  ALBUMIN 4.3   No results for input(s): LIPASE, AMYLASE in the last 168 hours. No results for input(s): AMMONIA in the last 168 hours. Diabetic: No results for input(s): HGBA1C in the last 72 hours. No results for input(s): GLUCAP in the last 168 hours. Cardiac Enzymes: No results for input(s): CKTOTAL, CKMB, CKMBINDEX, TROPONINI in the last 168 hours. No results for input(s): PROBNP in the last 8760 hours. Coagulation Profile: Recent Labs  Lab 03/29/20 1157  INR 0.9   Thyroid Function Tests: No results for input(s): TSH, T4TOTAL, FREET4, T3FREE, THYROIDAB in the last 72 hours. Lipid Profile: No results for input(s): CHOL, HDL, LDLCALC, TRIG, CHOLHDL, LDLDIRECT in the last 72 hours. Anemia Panel: No results for input(s): VITAMINB12, FOLATE, FERRITIN, TIBC, IRON, RETICCTPCT in the last 72 hours. Urine analysis:    Component Value Date/Time   COLORURINE YELLOW (A) 05/18/2018 1422   APPEARANCEUR CLEAR (A) 05/18/2018 1422   LABSPEC 1.013 05/18/2018 1422   PHURINE 7.0 05/18/2018 1422   GLUCOSEU NEGATIVE 05/18/2018 1422   HGBUR NEGATIVE 05/18/2018 1422   BILIRUBINUR NEGATIVE 05/18/2018 1422   KETONESUR NEGATIVE 05/18/2018 1422   PROTEINUR NEGATIVE 05/18/2018 1422   NITRITE NEGATIVE 05/18/2018 1422   LEUKOCYTESUR NEGATIVE 05/18/2018 1422    Sepsis Labs: Invalid input(s): PROCALCITONIN, LACTICIDVEN   Time coordinating discharge: 35 minutes  SIGNED:  Almon Hercules, MD  [facility] 03/31/2020, 2:44 PM  If 7PM-7AM, please contact night-coverage [email or web address]

## 2020-03-31 NOTE — Evaluation (Signed)
Physical Therapy Evaluation Patient Details Name: Peter Galvan MRN: [id number] DOB: [birth year] Today's Date: 03/31/2020   History of Present Illness  75 year old male with PMH of tobacco use presented to Ortho office with Right leg pain, swelling and numbness and had lower extremity ultrasound positive for extensive DVT, and directed to ED for further care.  He had no cardiopulmonary symptoms. He was started on a heparin infusion and is now being transitioned over to rivaroxaban. H&H, platelets are stable and WNL.    Clinical Impression  Patient alert and oriented and able to provided detailed history. Patient lives alone and prior to hospitalization was independent with all aspects of care and mobility with no device. He lives in a single story home with 4 steps to enter and bilateral handrails that cannot be reached at the same time. Upon PT evaluation, patient is I with bed mobility, mod I with transfers and ambulation using RW, and able to ascend/decend at least 4 steps with unilateral UE support and CGA for safety. Patient complains of pain at the right gastroc region with mobility and has altered gait pattern, with antalgic gait favoring R LE. Patient would benefit from skilled physical therapy to address impairments and functional limitations (see PT Problem List below) to work towards stated goals and return to PLOF or maximal functional independence. He would benefit from HHPT to help him return to prior level of function of mobility with no AD.       Follow Up Recommendations Home health PT    Equipment Recommendations  Rolling walker with 5" wheels    Recommendations for Other Services       Precautions / Restrictions Precautions Precautions: Fall Restrictions Weight Bearing Restrictions: No      Mobility  Bed Mobility Overal bed mobility: Independent             General bed mobility comments: supine to sit with no assistance.    Transfers Overall transfer  level: Needs assistance Equipment used: Rolling walker (2 wheeled) Transfers: Sit to/from Stand Sit to Stand: Supervision         General transfer comment: sit <> stand with cuing for hand placement and correct AD handling.  Ambulation/Gait Ambulation/Gait assistance: Min guard;Supervision;Modified independent (Device/Increase time) Gait Distance (Feet): 240 Feet Assistive device: None;Rolling walker (2 wheeled) Gait Pattern/deviations: Decreased stance time - right;Decreased weight shift to right Gait velocity: slow   General Gait Details: Patient ambulated around nursing station with RW and altered gait pattern, with right toe out, and antalgic gait favorin R LE. Consistent pain in R gastroc region throughout ambulation upt o 4/10. Patient also ambulated ~30 feet with no AD and CGA but stated he felt much more confident and looked more steady with RW. Ambulated an additional 60 feet with RW back to room with mod I.  Stairs Stairs: Yes Stairs assistance: Min guard Stair Management: One rail Right;One rail Left;Step to pattern;Alternating pattern;Forwards Number of Stairs: 8 General stair comments: Patient descended and ascended 8 steps with step to then step over step gait pattern, at first using B handrails, then unilateral hand rails for at least 4 of the 8 steps. Required CGA for safety.  Wheelchair Mobility    Modified Rankin (Stroke Patients Only)       Balance Overall balance assessment: Needs assistance   Sitting balance-Leahy Scale: Normal       Standing balance-Leahy Scale: Good Standing balance comment: Patient is more steady in dynamic standing with BUE support on RW but was  able to ambulate short distance with no AD with gait deviation (see gait above).                             Pertinent Vitals/Pain Pain Assessment: 0-10 Pain Score: 4  Pain Location: right tricesp surae and posterior distal thigh Pain Intervention(s): Limited activity within  patient's tolerance;Monitored during session;Repositioned;Patient requesting pain meds-RN notified    Home Living Family/patient expects to be discharged to:: Private residence Living Arrangements: Alone Available Help at Discharge: Other (Comment) (none) Type of Home: House Home Access: Stairs to enter Entrance Stairs-Rails: Right;Left (cannot reach both) Entrance Stairs-Number of Steps: 4 Home Layout: One level Home Equipment: Grab bars - tub/shower Additional Comments: Patient reports he has no equpiment at home except grab bars in the tub/shower    Prior Function Level of Independence: Independent         Comments: Patient reports he was independent in all aspects of care including driving.     Hand Dominance        Extremity/Trunk Assessment   Upper Extremity Assessment Upper Extremity Assessment: Overall WFL for tasks assessed    Lower Extremity Assessment Lower Extremity Assessment: RLE deficits/detail;Overall WFL for tasks assessed RLE Deficits / Details: Pateint finds R knee flexion restricted compared to baseline due to pain (but [facility]), strength appears [facility] but patient is gaurding due to pain RLE: Unable to fully assess due to pain    Cervical / Trunk Assessment Cervical / Trunk Assessment: Normal  Communication   Communication: No difficulties  Cognition Arousal/Alertness: Awake/alert Behavior During Therapy: WFL for tasks assessed/performed Overall Cognitive Status: Within Functional Limits for tasks assessed                                 General Comments: appears A&Ox4      General Comments General comments (skin integrity, edema, etc.): pt reports his right gastroc region feels swollen. Appears near nomral with visualization.    Exercises Other Exercises Other Exercises: Educated patinet on role of PT in acute care setting, discharge reccomendations, and proper AD use.   Assessment/Plan    PT Assessment Patient needs continued  PT services  PT Problem List Decreased strength;Decreased range of motion;Decreased activity tolerance;Decreased balance;Decreased mobility;Decreased knowledge of precautions;Pain;Decreased knowledge of use of DME       PT Treatment Interventions DME instruction;Gait training;Stair training;Functional mobility training;Therapeutic activities;Therapeutic exercise;Patient/family education;Neuromuscular re-education;Balance training    PT Goals (Current goals can be found in the Care Plan section)  Acute Rehab PT Goals Patient Stated Goal: to get better and go home PT Goal Formulation: With patient Time For Goal Achievement: 04/14/20 Potential to Achieve Goals: Good    Frequency Min 2X/week   Barriers to discharge Decreased caregiver support patient lives alone    Co-evaluation               AM-PAC PT "6 Clicks" Mobility  Outcome Measure Help needed turning from your back to your side while in a flat bed without using bedrails?: None Help needed moving from lying on your back to sitting on the side of a flat bed without using bedrails?: None Help needed moving to and from a bed to a chair (including a wheelchair)?: None Help needed standing up from a chair using your arms (e.g., wheelchair or bedside chair)?: None Help needed to walk in hospital room?: A Little Help needed  climbing 3-5 steps with a railing? : A Little 6 Click Score: 22    End of Session Equipment Utilized During Treatment: Gait belt Activity Tolerance: Patient tolerated treatment well;Patient limited by pain;Patient limited by fatigue Patient left: in chair;with call bell/phone within reach;with chair alarm set Nurse Communication: Mobility status PT Visit Diagnosis: Unsteadiness on feet (R26.81);Pain;Difficulty in walking, not elsewhere classified (R26.2) Pain - Right/Left: Right Pain - part of body: Leg    Time: 1054-1130 PT Time Calculation (min) (ACUTE ONLY): 36 min   Charges:   PT Evaluation $PT  Eval Low Complexity: 1 Low PT Treatments $Gait Training: 8-22 mins        Luretha Murphy. Ilsa Iha, PT, DPT 03/31/20, 11:59 AM

## 2020-03-31 NOTE — Progress Notes (Addendum)
ANTICOAGULATION CONSULT NOTE  Pharmacy Consult for transition from heparin drip to  rivaroxaban Indication: DVT  Patient Measurements: Height: 5\' 9"  (175.3 cm) Weight: 63.5 kg (140 lb) IBW/kg (Calculated) : 70.7  Vital Signs: Temp: 97.7 F (36.5 C) (03/06 0359) BP: 118/71 (03/06 0359) Pulse Rate: 77 (03/06 0359)  Labs: Recent Labs    03/29/20 1157 03/29/20 2247 03/30/20 0652 03/31/20 0523  HGB 15.2  --  13.1 13.0  HCT 45.7  --  38.6* 38.2*  PLT 211  --  197 214  APTT 29  --   --   --   LABPROT 12.2  --   --   --   INR 0.9  --   --   --   HEPARINUNFRC  --  0.65 0.49 0.40  CREATININE 1.15  --  1.03  --     Estimated Creatinine Clearance: 58.2 mL/min (by C-G formula based on SCr of 1.03 mg/dL).   Medical History: Past Medical History:  Diagnosis Date  . Alcohol abuse    a. reported seizure-like activity   . Tobacco abuse     Medications:  Scheduled:  . fluticasone  2 spray Each Nare Daily  . folic acid  1 mg Oral Daily  . multivitamin with minerals  1 tablet Oral Daily  . nicotine  21 mg Transdermal Daily  . sodium chloride flush  3 mL Intravenous Q12H  . sodium chloride flush  3 mL Intravenous Q12H  . thiamine  100 mg Oral Daily   Or  . thiamine  100 mg Intravenous Daily   Infusions:  . sodium chloride    . sodium chloride    . heparin 1,100 Units/hr (03/30/20 1039)    Assessment: 75 year old male with lower extremity ultrasound positive for extensive DVT. He was started on a heparin infusion and is now being transitioned over to rivaroxaban. H&H, platelets are stable and WNL.   Goal of Therapy:  Monitor platelets by anticoagulation protocol: Yes   Plan:   Stop heparin infusion  One hour after stopping heparin infusion start rivaroxaban 15 mg twice daily with food for 21 days followed by 20 mg once daily with food  CBC at least every 3 days per protocol   65 03/31/2020,7:28 AM

## 2020-04-01 ENCOUNTER — Other Ambulatory Visit: Payer: Medicare Other

## 2020-05-02 ENCOUNTER — Telehealth: Payer: Self-pay | Admitting: *Deleted

## 2020-05-02 ENCOUNTER — Encounter: Payer: Self-pay | Admitting: *Deleted

## 2020-05-02 DIAGNOSIS — Z122 Encounter for screening for malignant neoplasm of respiratory organs: Secondary | ICD-10-CM

## 2020-05-02 DIAGNOSIS — Z87891 Personal history of nicotine dependence: Secondary | ICD-10-CM

## 2020-05-02 NOTE — Telephone Encounter (Signed)
Received referral for initial lung cancer screening scan. Contacted patient and obtained smoking history,(former smoker, quit 02/2020; 1 ppd x 50 years) as well as answering questions related to screening process. Patient denies signs of lung cancer such as weight loss or hemoptysis. Patient denies comorbidity that would prevent curative treatment if lung cancer were found. Patient is scheduled for shared decision making visit and CT scan on 05/27/2020 at 2:00 pm.

## 2020-05-28 ENCOUNTER — Other Ambulatory Visit: Payer: Self-pay

## 2020-05-28 ENCOUNTER — Inpatient Hospital Stay: Payer: Medicare Other | Attending: Oncology | Admitting: Oncology

## 2020-05-28 ENCOUNTER — Ambulatory Visit
Admission: RE | Admit: 2020-05-28 | Discharge: 2020-05-28 | Disposition: A | Discharge status: Home / Self Care | Payer: Medicare Other | Source: Ambulatory Visit | Attending: Nurse Practitioner | Admitting: Nurse Practitioner

## 2020-05-28 DIAGNOSIS — Z87891 Personal history of nicotine dependence: Secondary | ICD-10-CM | POA: Diagnosis not present

## 2020-05-28 DIAGNOSIS — Z122 Encounter for screening for malignant neoplasm of respiratory organs: Secondary | ICD-10-CM | POA: Diagnosis present

## 2020-05-28 NOTE — Progress Notes (Signed)
Virtual Visit via Video Note  I connected with Mr. Patti on 05/28/20 at  2:00 PM EDT by a video enabled telemedicine application and verified that I am speaking with the correct person using two identifiers.  Location: Patient: [facility] Provider: Clinic   I discussed the limitations of evaluation and management by telemedicine and the availability of in person appointments. The patient expressed understanding and agreed to proceed.  I discussed the assessment and treatment plan with the patient. The patient was provided an opportunity to ask questions and all were answered. The patient agreed with the plan and demonstrated an understanding of the instructions.   The patient was advised to call back or seek an in-person evaluation if the symptoms worsen or if the condition fails to improve as anticipated.   In accordance with CMS guidelines, patient has met eligibility criteria including age, absence of signs or symptoms of lung cancer.  Social History   Tobacco Use  . Smoking status: Former Smoker    Packs/day: 1.00    Years: 50.00    Pack years: 50.00    Types: Cigarettes    Quit date: 03/25/2020    Years since quitting: 0.1  . Smokeless tobacco: Never Used  Substance Use Topics  . Alcohol use: Not Currently    Comment: a.  Previously reports being a heavy drinker though has not drank in 15 years  . Drug use: Yes    Types: Marijuana      A shared decision-making session was conducted prior to the performance of CT scan. This includes one or more decision aids, includes benefits and harms of screening, follow-up diagnostic testing, over-diagnosis, false positive rate, and total radiation exposure.   Counseling on the importance of adherence to annual lung cancer LDCT screening, impact of co-morbidities, and ability or willingness to undergo diagnosis and treatment is imperative for compliance of the program.   Counseling on the importance of continued smoking cessation for former  smokers; the importance of smoking cessation for current smokers, and information about tobacco cessation interventions have been given to patient including [place] and [facility] quit [place] programs.   Written order for lung cancer screening with LDCT has been given to the patient and any and all questions have been answered to the best of my abilities.    Yearly follow up will be coordinated by Burgess Estelle, Thoracic Navigator.  I provided 15 minutes of face-to-face video visit time during this encounter, and > 50% was spent counseling as documented under my assessment & plan.   Jacquelin Hawking, NP

## 2020-05-31 ENCOUNTER — Telehealth: Payer: Self-pay | Admitting: *Deleted

## 2020-05-31 NOTE — Telephone Encounter (Signed)

## 2020-12-03 ENCOUNTER — Emergency Department
Admission: EM | Admit: 2020-12-03 | Discharge: 2020-12-03 | Disposition: A | Discharge status: Home / Self Care | Payer: Medicare Other | Attending: Emergency Medicine | Admitting: Emergency Medicine

## 2020-12-03 ENCOUNTER — Emergency Department: Payer: Medicare Other

## 2020-12-03 ENCOUNTER — Encounter: Payer: Self-pay | Admitting: Emergency Medicine

## 2020-12-03 ENCOUNTER — Other Ambulatory Visit: Payer: Self-pay

## 2020-12-03 DIAGNOSIS — R109 Unspecified abdominal pain: Secondary | ICD-10-CM | POA: Diagnosis not present

## 2020-12-03 DIAGNOSIS — Z7901 Long term (current) use of anticoagulants: Secondary | ICD-10-CM | POA: Insufficient documentation

## 2020-12-03 DIAGNOSIS — M79662 Pain in left lower leg: Secondary | ICD-10-CM | POA: Diagnosis not present

## 2020-12-03 DIAGNOSIS — Z87891 Personal history of nicotine dependence: Secondary | ICD-10-CM | POA: Insufficient documentation

## 2020-12-03 DIAGNOSIS — M79605 Pain in left leg: Secondary | ICD-10-CM

## 2020-12-03 DIAGNOSIS — Z86718 Personal history of other venous thrombosis and embolism: Secondary | ICD-10-CM

## 2020-12-03 LAB — CBC WITH DIFFERENTIAL/PLATELET
Abs Immature Granulocytes: 0.02 10*3/uL (ref 0.00–0.07)
Basophils Absolute: 0.1 10*3/uL (ref 0.0–0.1)
Basophils Relative: 1 %
Eosinophils Absolute: 0.2 10*3/uL (ref 0.0–0.5)
Eosinophils Relative: 3 %
HCT: 42.3 % (ref 39.0–52.0)
Hemoglobin: 14.4 g/dL (ref 13.0–17.0)
Immature Granulocytes: 0 %
Lymphocytes Relative: 29 %
Lymphs Abs: 2.1 10*3/uL (ref 0.7–4.0)
MCH: 31.6 pg (ref 26.0–34.0)
MCHC: 34 g/dL (ref 30.0–36.0)
MCV: 92.8 fL (ref 80.0–100.0)
Monocytes Absolute: 0.5 10*3/uL (ref 0.1–1.0)
Monocytes Relative: 6 %
Neutro Abs: 4.4 10*3/uL (ref 1.7–7.7)
Neutrophils Relative %: 61 %
Platelets: 266 10*3/uL (ref 150–400)
RBC: 4.56 MIL/uL (ref 4.22–5.81)
RDW: 13.6 % (ref 11.5–15.5)
WBC: 7.2 10*3/uL (ref 4.0–10.5)
nRBC: 0 % (ref 0.0–0.2)

## 2020-12-03 LAB — BASIC METABOLIC PANEL
Anion gap: 7 (ref 5–15)
BUN: 22 mg/dL (ref 8–23)
CO2: 28 mmol/L (ref 22–32)
Calcium: 9.8 mg/dL (ref 8.9–10.3)
Chloride: 103 mmol/L (ref 98–111)
Creatinine, Ser: 1.34 mg/dL — ABNORMAL HIGH (ref 0.61–1.24)
GFR, Estimated: 56 mL/min — ABNORMAL LOW (ref >60)
Glucose, Bld: 99 mg/dL (ref 70–99)
Potassium: 4.7 mmol/L (ref 3.5–5.1)
Sodium: 138 mmol/L (ref 135–145)

## 2020-12-03 LAB — PROTIME-INR
INR: 1.7 — ABNORMAL HIGH (ref 0.8–1.2)
Prothrombin Time: 19.6 seconds — ABNORMAL HIGH (ref 11.4–15.2)

## 2020-12-03 NOTE — ED Triage Notes (Signed)
Pt reports that he has had pain in his left leg for the last three weeks that has gotten worse it goes from his groin to his ankle. He has had a blood clot in his right leg last year and this feels similar. He was give an RX for [employer] and it ran out of it in June he recently started back on it three weeks ago.

## 2020-12-03 NOTE — ED Notes (Signed)
See triage note  presents with left leg pain  states pain started about 3 weeks ago  now pain is getting worse  ambulates well

## 2020-12-03 NOTE — Discharge Instructions (Signed)
Your ultrasound did not show any blood clot in your leg today.  You should follow-up with your primary care provider to determine whether you need to be taking the blood thinner or not.  You can take Tylenol for discomfort.

## 2020-12-03 NOTE — ED Provider Notes (Signed)
[facility]  ____________________________________________   Event Date/Time   First MD Initiated Contact with Patient 12/03/20 1449     (approximate)  I have reviewed the triage vital signs and the nursing notes.   HISTORY  Chief Complaint Leg Pain    HPI Peter Galvan is a 75 y.o. 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  Patient tells me that he had a DVT back in February and was prescribed Xarelto with 5 refills.  He ran out of this several months ago but then tells me that he spoke to a pharmacist who then prescribed it for him again and he has been taking it for the past 2 weeks.  For several weeks he has noticed some pain shooting from the left great toe up to the left ankle.  Denies any trauma.  He is also had some discomfort in the left groin area.  He is concerned about another blood clot.  Denies chest pain shortness of breath.  No fevers or chills.         Past Medical History:  Diagnosis Date   Alcohol abuse    a. reported seizure-like activity    Tobacco abuse     Patient Active Problem List   Diagnosis Date Noted   DVT (deep venous thrombosis) (HCC) 03/29/2020   Alcohol abuse    Tobacco abuse    Chest pain 05/18/2018    History reviewed. No pertinent surgical history.  Prior to Admission medications   Medication Sig Start Date End Date Taking? Authorizing Provider  rivaroxaban (XARELTO) 20 MG TABS tablet Take 1 tablet (20 mg total) by mouth daily with supper. Start after you finish the starter pack 05/01/20   Almon Hercules, MD  RIVAROXABAN Carlena Hurl) VTE STARTER PACK (15 & 20 MG) Follow package directions: Take one 15mg  tablet by mouth twice a day. On day 22, switch to one 20mg  tablet once a day. Take with food. 03/31/20   , MD    Allergies Codeine and Penicillins  Family History  Problem Relation Age of Onset   Hypertension Father    Heart disease  Son        a. s/p heart transplant     Social History Social History   Tobacco Use   Smoking status: Former    Packs/day: 1.00    Years: 50.00    Pack years: 50.00    Types: Cigarettes    Quit date: 03/25/2020    Years since quitting: 0.6   Smokeless tobacco: Never  Substance Use Topics   Alcohol use: Not Currently    Comment: a.  Previously reports being a heavy drinker though has not drank in 15 years   Drug use: Yes    Types: Marijuana    Review of Systems   Review of Systems  Constitutional:  Negative for chills and fever.  Respiratory:  Negative for shortness of breath.   Cardiovascular:  Negative for chest pain and leg swelling.  Musculoskeletal:  Positive for arthralgias and myalgias. Negative for joint swelling.  All other systems reviewed and are negative.  Physical Exam Updated Vital Signs BP 140/88 (BP Location: Left Arm)   Pulse 78   Temp 98 F (36.7 C) (Oral)   Resp 18   Ht 5\' 9"  (1.753 m)   Wt 63.5 kg   SpO2 98%   BMI 20.67 kg/m   Physical Exam Vitals and nursing note  reviewed.  Constitutional:      General: He is not in acute distress.    Appearance: Normal appearance.  HENT:     Head: Normocephalic and atraumatic.  Eyes:     General: No scleral icterus.    Conjunctiva/sclera: Conjunctivae normal.  Pulmonary:     Effort: Pulmonary effort is normal. No respiratory distress.     Breath sounds: Normal breath sounds. No wheezing.  Musculoskeletal:        General: No deformity or signs of injury.     Cervical back: Normal range of motion.     Comments: No lower extremity swelling, edema erythema, 2+ DP pulse, mild tenderness to palpation along the left anterior tibial tendon no swelling to the ankle, normal range of motion  No swelling tenderness or masses in the groin, testicles nontender nonswollen  Skin:    Coloration: Skin is not jaundiced or pale.  Neurological:     General: No focal deficit present.     Mental Status: He is alert and  oriented to person, place, and time. Mental status is at baseline.  Psychiatric:        Mood and Affect: Mood normal.        Behavior: Behavior normal.     LABS (all labs ordered are listed, but only abnormal results are displayed)  Labs Reviewed  BASIC METABOLIC PANEL - Abnormal; Notable for the following components:      Result Value   Creatinine, Ser 1.34 (*)    GFR, Estimated 56 (*)    All other components within normal limits  PROTIME-INR - Abnormal; Notable for the following components:   Prothrombin Time 19.6 (*)    INR 1.7 (*)    All other components within normal limits  CBC WITH DIFFERENTIAL/PLATELET   ____________________________________________  EKG  N/a ____________________________________________  RADIOLOGY Ky Barban, personally viewed and evaluated these images (plain radiographs) as part of my medical decision making, as well as reviewing the written report by the radiologist.  ED MD interpretation: I reviewed the ultrasound of the left lower extremity which is negative for DVT    ____________________________________________   PROCEDURES  Procedure(s) performed (including Critical Care):  Procedures   ____________________________________________   INITIAL IMPRESSION / ASSESSMENT AND PLAN / ED COURSE  75 year old male with a history of prior DVT he was previously on Xarelto who presents with concern of left lower extremity pain.  Apparently finished his course of Xarelto but then was restarted on it 2 weeks ago which she tells me was by the pharmacy.  Does not seem like he is being followed by primary care provider for anticoagulation.  He has had some left foot and ankle pain as well as left groin pain.  On exam there is no evidence of DVT the leg is nonswollen nontender and if anything he is somewhat tender along the left anterior tibial tendon.  He also complains of some left groin pain however there is no hernia, swelling or other  lesions in this area.  Ultrasound of the left leg was negative for DVT.  Patient is now back on Xarelto.  Discussed the negative ultrasound findings today.  I advised that he strongly follow-up with his primary care provider to determine whether he needs to be on anticoagulation given the risks.     ____________________________________________   FINAL CLINICAL IMPRESSION(S) / ED DIAGNOSES  Final diagnoses:  Pain of left lower extremity     ED Discharge Orders     None  Note:  This document was prepared using Dragon voice recognition software and may include unintentional dictation errors.    Georga Hacking, MD 12/03/20 [phone]

## 2021-07-24 ENCOUNTER — Telehealth: Payer: Self-pay | Admitting: Acute Care

## 2021-07-24 NOTE — Telephone Encounter (Signed)
Attempted to reach pt to schedule annual LDCT.  Phone rang with no answer or voicemail until the very end and then went to a busy signal

## 2021-11-10 IMAGING — CT CT CHEST LUNG CANCER SCREENING LOW DOSE W/O CM
2 of 5 series · 15 of 40 positions shown, 18 images · non-contrast
Comparison: No comparison studies available.

CLINICAL DATA: 74-year-old male with 50 pack-year history of
smoking. Lung cancer screening.

EXAM:
CT CHEST WITHOUT CONTRAST LOW-DOSE FOR LUNG CANCER SCREENING
TECHNIQUE: Multidetector CT imaging of the chest was performed following the
standard protocol without IV contrast.

[Series 3: lung 1.00 · axial · 0.77mm/px · z∈[-1164,-897]mm · 12 of 295 slices shown, 15 images]
[im 14/295  mediastinal]
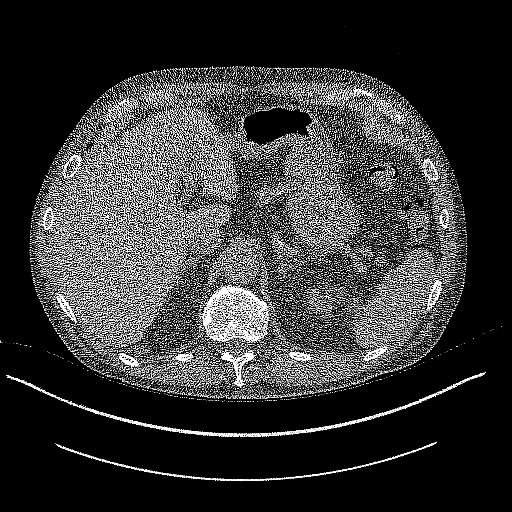
[im 14/295  lung]
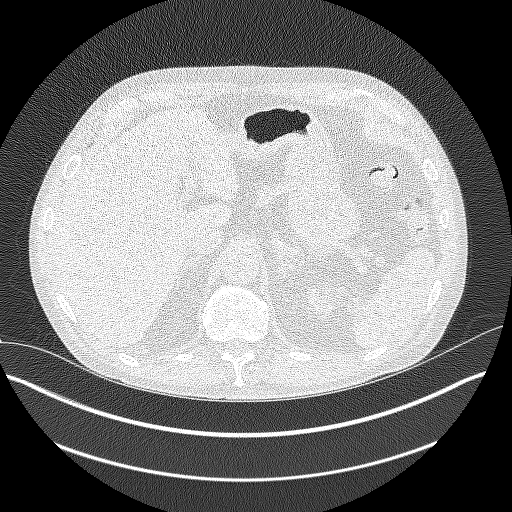
[im 41/295  lung]
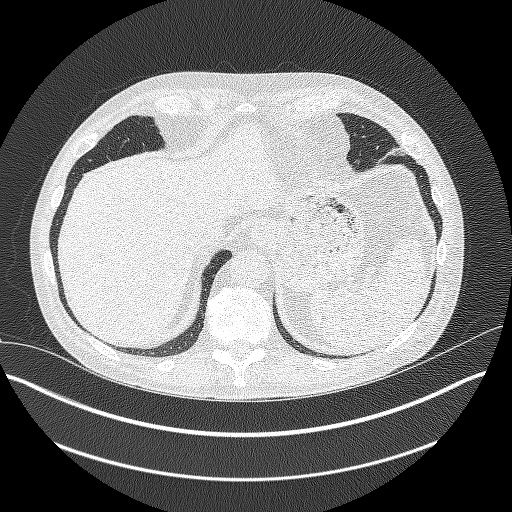
[im 67/295  lung]
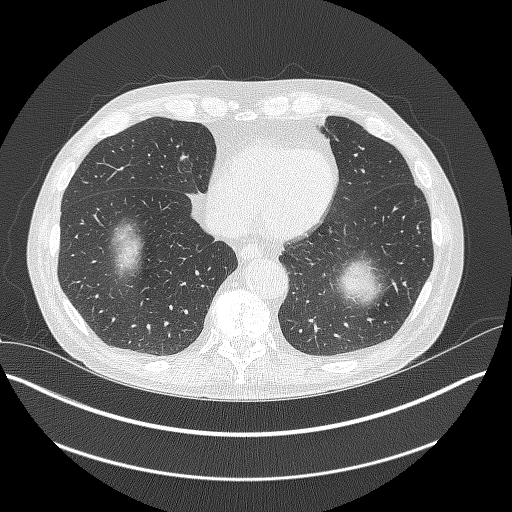
[im 94/295  lung]
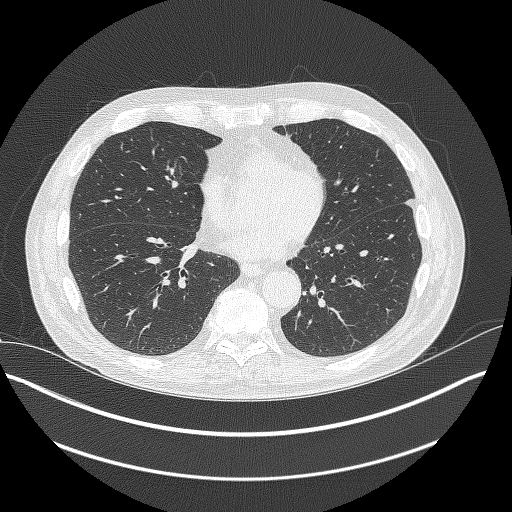
[im 107/295  mediastinal]
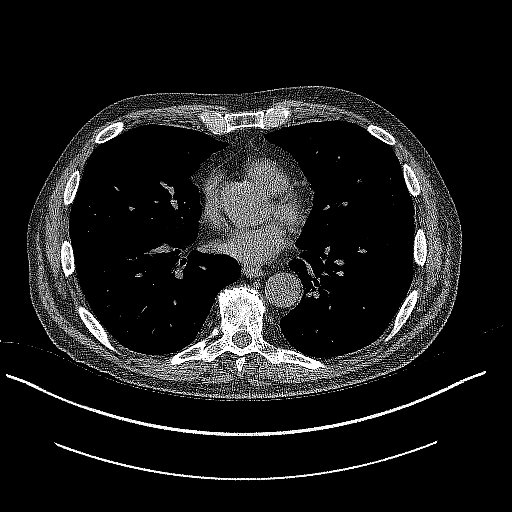
[im 107/295  lung]
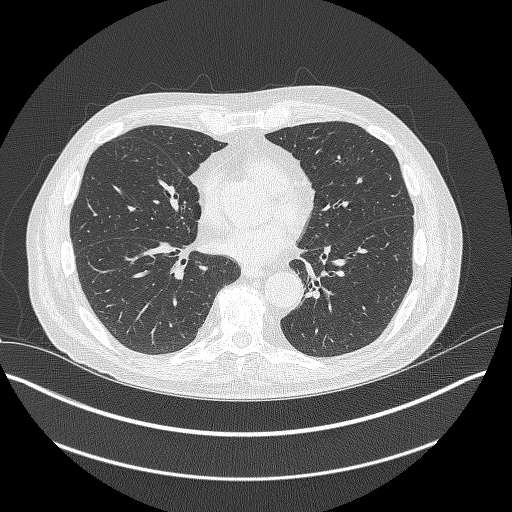
[im 134/295  lung]
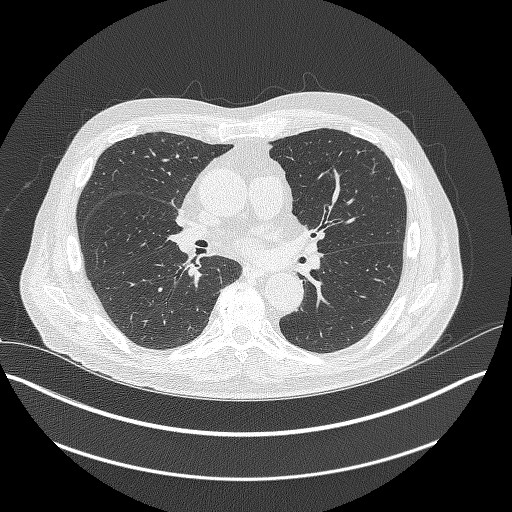
[im 161/295  lung]
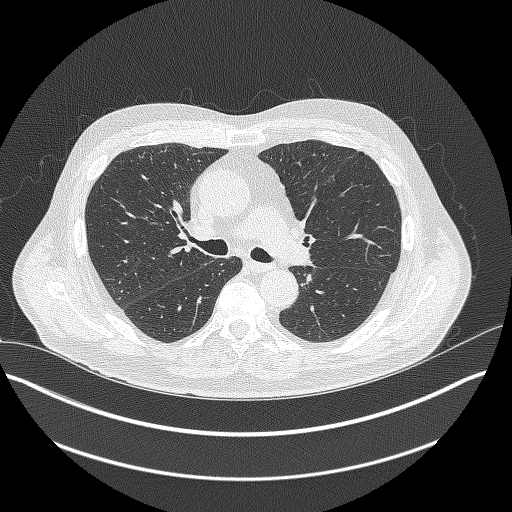
[im 188/295  lung]
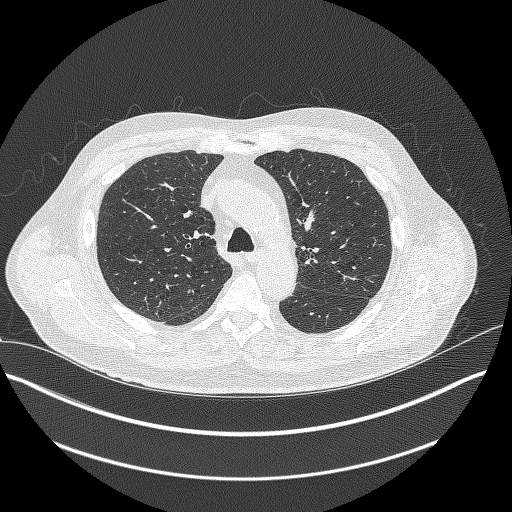
[im 201/295  mediastinal]
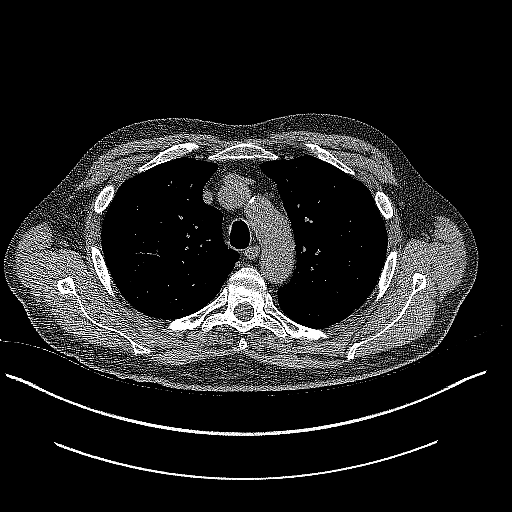
[im 201/295  lung]
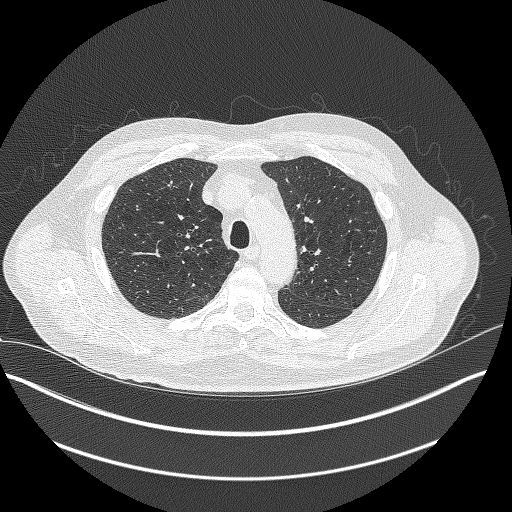
[im 228/295  lung]
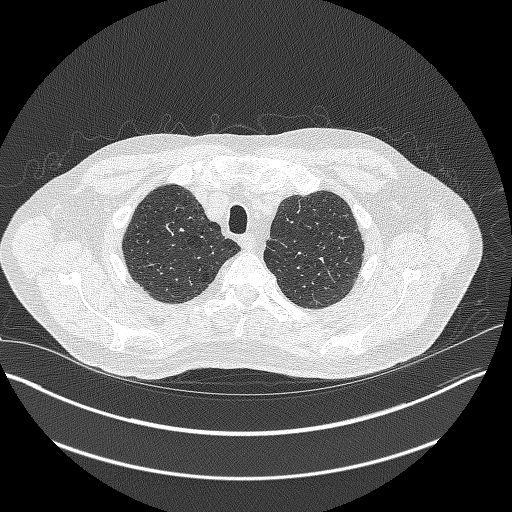
[im 254/295  lung]
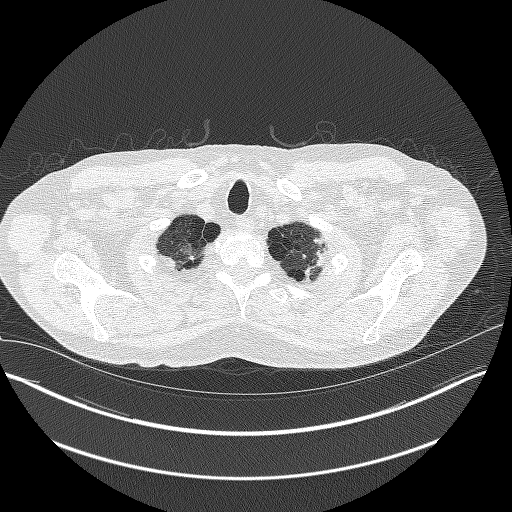
[im 281/295  lung]
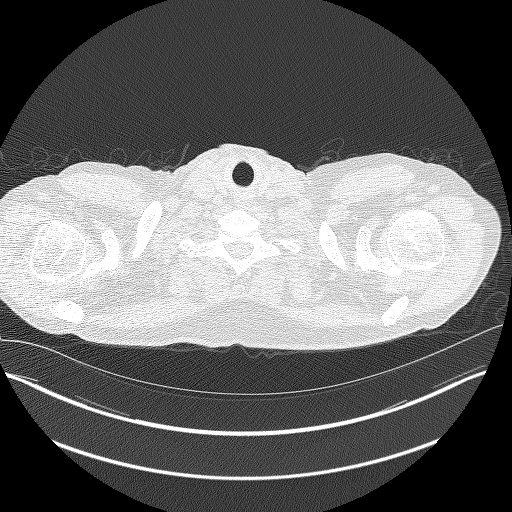

[Series 5: coronals lung 1.00 cor · coronal · 0.58mm/px · 3 of 361 slices shown]
[im 73/361  lung]
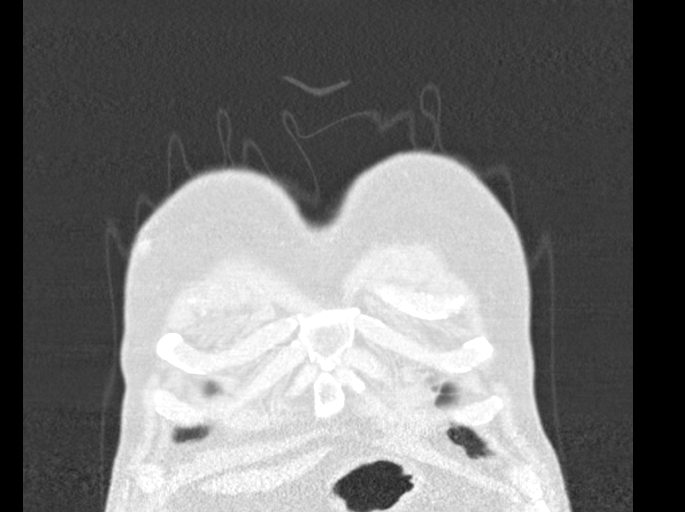
[im 145/361  lung]
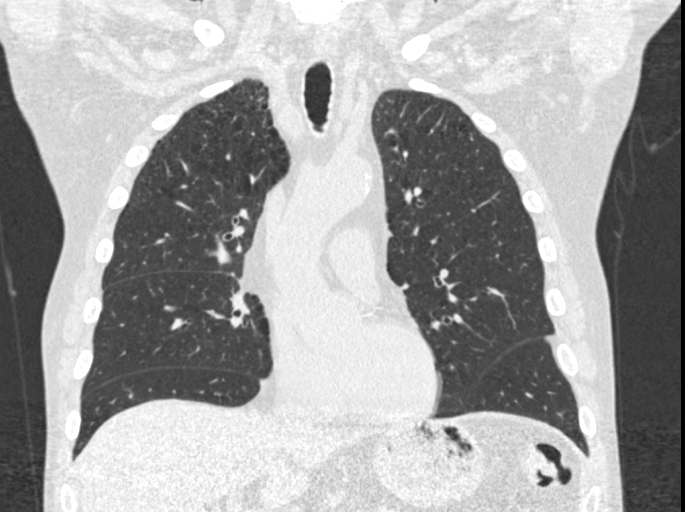
[im 217/361  lung]
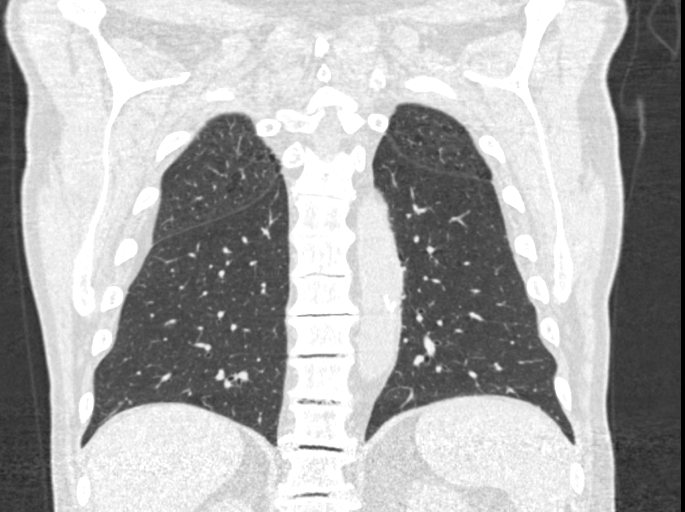

[15 of 40 positions shown; findings below may reference images not displayed]

FINDINGS: Cardiovascular: The heart size is normal. No substantial pericardial
effusion. Coronary artery calcification is evident. Atherosclerotic
calcification is noted in the wall of the thoracic aorta.

Mediastinum/Nodes: No mediastinal lymphadenopathy. No evidence for
gross hilar lymphadenopathy although assessment is limited by the
lack of intravenous contrast on today's study. The esophagus has
normal imaging features. There is no axillary lymphadenopathy.

Lungs/Pleura: Centrilobular emphsyema noted. Biapical
pleuroparenchymal scarring. Tiny pulmonary nodules evident measuring
up to 2.3 mm. No suspicious nodule or mass. No focal airspace
consolidation. No pleural effusion.

Upper Abdomen: Unremarkable.

Musculoskeletal: No worrisome lytic or sclerotic osseous
abnormality.
IMPRESSION: 1. Lung-RADS 2, benign appearance or behavior. Continue annual
screening with low-dose chest CT without contrast in 12 months.
2. Aortic Atherosclerosis (JHVXX-K4F.F) and Emphysema (JHVXX-UKP.4).

## 2022-07-01 ENCOUNTER — Other Ambulatory Visit
Admission: RE | Admit: 2022-07-01 | Discharge: 2022-07-01 | Disposition: A | Discharge status: Home / Self Care | Payer: Medicare Other | Source: Ambulatory Visit | Attending: Internal Medicine | Admitting: Internal Medicine

## 2022-07-01 ENCOUNTER — Emergency Department: Payer: Medicare Other

## 2022-07-01 ENCOUNTER — Emergency Department
Admission: EM | Admit: 2022-07-01 | Discharge: 2022-07-02 | Disposition: A | Discharge status: Home / Self Care | Payer: Medicare Other | Attending: Emergency Medicine | Admitting: Emergency Medicine

## 2022-07-01 ENCOUNTER — Other Ambulatory Visit: Payer: Self-pay

## 2022-07-01 DIAGNOSIS — M79622 Pain in left upper arm: Secondary | ICD-10-CM | POA: Insufficient documentation

## 2022-07-01 DIAGNOSIS — M542 Cervicalgia: Secondary | ICD-10-CM | POA: Diagnosis not present

## 2022-07-01 DIAGNOSIS — M79602 Pain in left arm: Secondary | ICD-10-CM | POA: Insufficient documentation

## 2022-07-01 DIAGNOSIS — G8929 Other chronic pain: Secondary | ICD-10-CM | POA: Insufficient documentation

## 2022-07-01 DIAGNOSIS — R799 Abnormal finding of blood chemistry, unspecified: Secondary | ICD-10-CM | POA: Diagnosis present

## 2022-07-01 DIAGNOSIS — R899 Unspecified abnormal finding in specimens from other organs, systems and tissues: Secondary | ICD-10-CM

## 2022-07-01 LAB — COMPREHENSIVE METABOLIC PANEL
ALT: 13 U/L (ref 0–44)
AST: 16 U/L (ref 15–41)
Albumin: 4.2 g/dL (ref 3.5–5.0)
Alkaline Phosphatase: 85 U/L (ref 38–126)
Anion gap: 9 (ref 5–15)
BUN: 26 mg/dL — ABNORMAL HIGH (ref 8–23)
CO2: 27 mmol/L (ref 22–32)
Calcium: 9.2 mg/dL (ref 8.9–10.3)
Chloride: 103 mmol/L (ref 98–111)
Creatinine, Ser: 1.25 mg/dL — ABNORMAL HIGH (ref 0.61–1.24)
GFR, Estimated: >60 mL/min (ref >60)
Glucose, Bld: 116 mg/dL — ABNORMAL HIGH (ref 70–99)
Potassium: 3.7 mmol/L (ref 3.5–5.1)
Sodium: 139 mmol/L (ref 135–145)
Total Bilirubin: 0.5 mg/dL (ref 0.3–1.2)
Total Protein: 7.4 g/dL (ref 6.5–8.1)

## 2022-07-01 LAB — CBC WITH DIFFERENTIAL/PLATELET
Abs Immature Granulocytes: 0.02 10*3/uL (ref 0.00–0.07)
Basophils Absolute: 0.1 10*3/uL (ref 0.0–0.1)
Basophils Relative: 1 %
Eosinophils Absolute: 0.3 10*3/uL (ref 0.0–0.5)
Eosinophils Relative: 3 %
HCT: 42.4 % (ref 39.0–52.0)
Hemoglobin: 14.1 g/dL (ref 13.0–17.0)
Immature Granulocytes: 0 %
Lymphocytes Relative: 27 %
Lymphs Abs: 2.3 10*3/uL (ref 0.7–4.0)
MCH: 31.1 pg (ref 26.0–34.0)
MCHC: 33.3 g/dL (ref 30.0–36.0)
MCV: 93.4 fL (ref 80.0–100.0)
Monocytes Absolute: 0.6 10*3/uL (ref 0.1–1.0)
Monocytes Relative: 7 %
Neutro Abs: 5.4 10*3/uL (ref 1.7–7.7)
Neutrophils Relative %: 62 %
Platelets: 238 10*3/uL (ref 150–400)
RBC: 4.54 MIL/uL (ref 4.22–5.81)
RDW: 13.2 % (ref 11.5–15.5)
WBC: 8.6 10*3/uL (ref 4.0–10.5)
nRBC: 0 % (ref 0.0–0.2)

## 2022-07-01 LAB — D-DIMER, QUANTITATIVE
D-Dimer, Quant: 1.27 ug/mL-FEU — ABNORMAL HIGH (ref 0.00–0.50)
D-Dimer, Quant: 1.29 ug/mL-FEU — ABNORMAL HIGH (ref 0.00–0.50)

## 2022-07-01 NOTE — ED Provider Notes (Signed)
[facility] Provider Note    Event Date/Time   First MD Initiated Contact with Patient 07/01/22 2254     (approximate)   History   abnormal labs   HPI  Peter Galvan is a 77 y.o. male who presents to the ED for evaluation of abnormal labs   I review a PCP visit from earlier today.  Seen for chronic left arm and neck pain.  Left bicep knot. A D-dimer was sent and returns positive at 1.27.   Patient presents to the ED because of his abnormal outpatient D-dimer.  Reports getting a call back in the clinic and told he needs to come to the ER.  He describes a chronic, 2+ months, a "knot" on his left bicep.  No trauma or injuries.  Also reports some shooting pain from his neck and shoulder that he was told is arthritis but is not bothering him this evening.  He reports feeling fine and has no complaints.  No chest pain, dyspnea, syncope   Physical Exam   Triage Vital Signs: ED Triage Vitals [07/01/22 1857]  Enc Vitals Group     BP (!) 149/79     Pulse Rate 80     Resp 18     Temp 98.6 F (37 C)     Temp Source Oral     SpO2 98 %     Weight 139 lb 15.9 oz (63.5 kg)     Height      Head Circumference      Peak Flow      Pain Score 1     Pain Loc      Pain Edu?      Excl. in GC?     Most recent vital signs: Vitals:   07/01/22 1857 07/01/22 2343  BP: (!) 149/79 (!) 151/76  Pulse: 80 76  Resp: 18 18  Temp: 98.6 F (37 C) 98.5 F (36.9 C)  SpO2: 98% 97%    General: Awake, no distress.  CV:  Good peripheral perfusion.  Resp:  Normal effort.  Abd:  No distention.  MSK:  No deformity noted.  Neuro:  No focal deficits appreciated. Other:  No palpable nodule or pathology appreciated to his left bicep at his area of concern.  I can appreciate a mild asymmetry of the muscle mass of his biceps when compared to the contralateral side but no discrete other derangements or skin changes.  Left arm is neurovascularly intact and symmetric to the  right.   ED Results / Procedures / Treatments   Labs (all labs ordered are listed, but only abnormal results are displayed) Labs Reviewed  COMPREHENSIVE METABOLIC PANEL - Abnormal; Notable for the following components:      Result Value   Glucose, Bld 116 (*)    BUN 26 (*)    Creatinine, Ser 1.25 (*)    All other components within normal limits  D-DIMER, QUANTITATIVE - Abnormal; Notable for the following components:   D-Dimer, Quant 1.29 (*)    All other components within normal limits  CBC WITH DIFFERENTIAL/PLATELET    EKG   RADIOLOGY Venous ultrasound of the left upper extremity without signs of DVT, interpreted by me  Official radiology report(s): CT Angio Chest PE W and/or Wo Contrast  Result Date: 07/02/2022 CLINICAL DATA:  Left shoulder and biceps pain with positive D-dimer. EXAM: CT ANGIOGRAPHY CHEST WITH CONTRAST TECHNIQUE: Multidetector CT imaging of the chest was performed using the standard protocol during bolus  administration of intravenous contrast. Multiplanar CT image reconstructions and MIPs were obtained to evaluate the vascular anatomy. RADIATION DOSE REDUCTION: This exam was performed according to the departmental dose-optimization program which includes automated exposure control, adjustment of the mA and/or kV according to patient size and/or use of iterative reconstruction technique. CONTRAST:  75mL OMNIPAQUE IOHEXOL 350 MG/ML SOLN COMPARISON:  05/28/2020 FINDINGS: Cardiovascular: Technically adequate study with good opacification of the central and segmental pulmonary arteries. No focal filling defects. No evidence of significant pulmonary embolus. Normal heart size. No pericardial effusions. Normal caliber thoracic aorta. Calcification in the aorta and coronary arteries. Mediastinum/Nodes: Choose line Lungs/Pleura: Emphysematous changes throughout the lungs. Scarring in the lung apices. No airspace disease or consolidation. No pleural effusions. No pneumothorax.  Upper Abdomen: No acute process is identified. Musculoskeletal: Degenerative changes in the spine. Review of the MIP images confirms the above findings. IMPRESSION: 1. No evidence of significant pulmonary embolus. 2. No evidence of active pulmonary disease. Diffuse emphysematous changes and scarring in the lungs. 3. Aortic atherosclerosis. Electronically Signed   By: Burman Nieves M.D.   On: 07/02/2022 00:25   US Venous Img Upper Uni Left  Result Date: 07/01/2022 CLINICAL DATA:  Left upper extremity pain EXAM: Left UPPER EXTREMITY VENOUS DOPPLER ULTRASOUND TECHNIQUE: Gray-scale sonography with graded compression, as well as color Doppler and duplex ultrasound were performed to evaluate the upper extremity deep venous system from the level of the subclavian vein and including the jugular, axillary, basilic, radial, ulnar and upper cephalic vein. Spectral Doppler was utilized to evaluate flow at rest and with distal augmentation maneuvers. COMPARISON:  None Available. FINDINGS: Contralateral Subclavian Vein: Respiratory phasicity is normal and symmetric with the symptomatic side. No evidence of thrombus. Normal compressibility. Internal Jugular Vein: No evidence of thrombus. Normal compressibility, respiratory phasicity and response to augmentation. Subclavian Vein: No evidence of thrombus. Normal compressibility, respiratory phasicity and response to augmentation. Axillary Vein: No evidence of thrombus. Normal compressibility, respiratory phasicity and response to augmentation. Cephalic Vein: No evidence of thrombus. Normal compressibility, respiratory phasicity and response to augmentation. Basilic Vein: No evidence of thrombus. Normal compressibility, respiratory phasicity and response to augmentation. Brachial Veins: No evidence of thrombus. Normal compressibility, respiratory phasicity and response to augmentation. Radial Veins: No evidence of thrombus. Normal compressibility, respiratory phasicity and  response to augmentation. Ulnar Veins: No evidence of thrombus. Normal compressibility, respiratory phasicity and response to augmentation. Venous Reflux:  None visualized. Other Findings:  None visualized. IMPRESSION: No evidence of DVT within the left upper extremity. Electronically Signed   By: Minerva Fester M.D.   On: 07/01/2022 20:12    PROCEDURES and INTERVENTIONS:  Procedures  Medications  iohexol (OMNIPAQUE) 350 MG/ML injection 75 mL (75 mLs Intravenous Contrast Given 07/02/22 0008)     IMPRESSION / MDM / ASSESSMENT AND PLAN / ED COURSE  I reviewed the triage vital signs and the nursing notes.  Differential diagnosis includes, but is not limited to, PE, muscular spasm, biceps tendinous rupture, COPD exacerbation, peripheral lung nodule or cancer.  DVT  {Patient presents with symptoms of an acute illness or injury that is potentially life-threatening.  Patient presents with abnormal outpatient D-dimer, without evidence of acute pathology and suitable for outpatient management.  He is concerned about a possible bump or lump on his left bicep, but I do not appreciate anything discrete beyond the seen in the appropriate biceps musculature.  CTA chest without peripheral pathology such as a PE or other derangement.  Has a negative ultrasound of  his left upper extremity.  normal CBC.  Discharged with orthopedic referral information      FINAL CLINICAL IMPRESSION(S) / ED DIAGNOSES   Final diagnoses:  Abnormal laboratory test result     Rx / DC Orders   ED Discharge Orders     None        Note:  This document was prepared using Dragon voice recognition software and may include unintentional dictation errors.   Delton Prairie, MD 07/02/22 [phone]

## 2022-07-01 NOTE — ED Triage Notes (Addendum)
Pt went to UC today with complaints of knot in left upper arm. Bloodwork was drawn and pt was called to come to ER for elevated d-dimer.

## 2022-07-02 ENCOUNTER — Emergency Department: Payer: Medicare Other

## 2022-07-02 MED ORDER — IOHEXOL 350 MG/ML SOLN
75.0000 mL | Freq: Once | INTRAVENOUS | Status: AC | PRN
  Administered 2022-07-02: 75 mL via INTRAVENOUS

## 2022-08-19 ENCOUNTER — Ambulatory Visit: Payer: Medicare Other | Admitting: Urology

## 2022-08-19 ENCOUNTER — Encounter: Payer: Self-pay | Admitting: Urology

## 2022-08-19 VITALS — BP 126/77 | HR 87 | Ht 69.0 in | Wt 125.0 lb

## 2022-08-19 DIAGNOSIS — R972 Elevated prostate specific antigen [PSA]: Secondary | ICD-10-CM

## 2022-08-19 NOTE — Progress Notes (Signed)
I, Duke Salvia, acting as a scribe for Riki Altes, MD., have documented all relevant documentation on the behalf of Riki Altes, MD, as directed by  Riki Altes, MD while in the presence of Riki Altes, MD.  08/19/2022 12:53 PM   Peter Galvan Sep 22, 1945 [id number]  Referring provider: Dorothey Baseman, MD [phone] [place],  [place] [address]  Chief Complaint  Patient presents with   Elevated PSA    HPI: Peter Galvan is a 77 y.o. male referred for evaluation of elevated PSA.  Initially noted to have a PSA at 6.7 in March 2022, which was repeated in August 2022 and was 5.9. Had not been seen since 2022 until recently and his PSA was repeated 07/22/2022 and had increased to 9.4 (% free 18.9). No bothersome LUTS. No family history of prostate cancer. Urinalysis performed at the time PSA was drawn was normal.   PMH: Past Medical History:  Diagnosis Date   Alcohol abuse    a. reported seizure-like activity    Tobacco abuse      Home Medications:  Allergies as of 08/19/2022       Reactions   Codeine Itching, Nausea And Vomiting   Penicillins Hives   Did it involve swelling of the face/tongue/throat, SOB, or low BP? No Did it involve sudden or severe rash/hives, skin peeling, or any reaction on the inside of your mouth or nose? No Did you need to seek medical attention at a hospital or doctor's office? No When did it last happen?      childhood If all above answers are "NO", may proceed with cephalosporin use.        Medication List        Accurate as of August 19, 2022 12:53 PM. If you have any questions, ask your nurse or doctor.          Rivaroxaban Starter Pack (15 mg and 20 mg) Commonly known as: XARELTO STARTER PACK Follow package directions: Take one 15mg  tablet by mouth twice a day. On day 22, switch to one 20mg  tablet once a day. Take with food.   rivaroxaban 20 MG Tabs tablet Commonly known as: XARELTO Take 1 tablet  (20 mg total) by mouth daily with supper. Start after you finish the starter pack        Allergies:  Allergies  Allergen Reactions   Codeine Itching and Nausea And Vomiting   Penicillins Hives    Did it involve swelling of the face/tongue/throat, SOB, or low BP? No Did it involve sudden or severe rash/hives, skin peeling, or any reaction on the inside of your mouth or nose? No Did you need to seek medical attention at a hospital or doctor's office? No When did it last happen?      childhood If all above answers are "NO", may proceed with cephalosporin use.    Family History: Family History  Problem Relation Age of Onset   Hypertension Father    Heart disease Son        a. s/p heart transplant     Social History:  reports that he quit smoking about 2 years ago. His smoking use included cigarettes. He started smoking about 52 years ago. He has a 50 pack-year smoking history. He has never used smokeless tobacco. He reports that he does not currently use alcohol. He reports current drug use. Drug: Marijuana.   Physical Exam: BP 126/77   Pulse 87   Ht  5\' 9"  (1.753 m)   Wt 125 lb (56.7 kg)   BMI 18.46 kg/m   Constitutional:  Alert, No acute distress. HEENT: [place] AT Respiratory: Normal respiratory effort, no increased work of breathing. GU: Refused DRE, stating he had it performed in his 15s and it was too painful. Psychiatric: Normal mood and affect.   Assessment & Plan:    1. Elevated PSA Refused DRE. Although PSA is a prostate cancer screening test he was informed that cancer is not the most common cause of an elevated PSA. Other potential causes including BPH and inflammation were discussed. He was informed that the only way to adequately diagnose prostate cancer would be a transrectal ultrasound and biopsy of the prostate. The procedure was discussed including potential risks of bleeding and infection/sepsis. He was also informed that a negative biopsy does not conclusively  rule out the possibility that prostate cancer may be present and that continued monitoring is required. The use of newer adjunctive blood tests including PHI and 4kScore were discussed. The use of multiparametric prostate MRI to evaluate for lesions suspicious for high-grade prostate cancer and aid in targeted biopsy was reviewed. Continued periodic surveillance was also discussed.  He was agreeable to a proste MRI, although he was informed if MRI is suspicious for high grade prostate cancer, a prostate biopsy would be recommended which is performed transrectally.   I have reviewed the above documentation for accuracy and completeness, and I agree with the above.   Riki Altes, MD  [facility] [address] [place], [place] [address] [phone]

## 2022-08-31 ENCOUNTER — Ambulatory Visit
Admission: RE | Admit: 2022-08-31 | Discharge: 2022-08-31 | Disposition: A | Discharge status: Home / Self Care | Payer: Medicare Other | Source: Ambulatory Visit | Attending: Urology | Admitting: Urology

## 2022-08-31 DIAGNOSIS — R972 Elevated prostate specific antigen [PSA]: Secondary | ICD-10-CM | POA: Insufficient documentation

## 2022-08-31 MED ORDER — GADOBUTROL 1 MMOL/ML IV SOLN
5.0000 mL | Freq: Once | INTRAVENOUS | Status: AC | PRN
  Administered 2022-08-31: 5 mL via INTRAVENOUS

## 2022-09-02 ENCOUNTER — Telehealth: Payer: Self-pay | Admitting: Urology

## 2022-09-02 NOTE — Telephone Encounter (Signed)
Prostate MRI showed no findings suspicious for high-grade prostate cancer.  15-20% of the time prostate MRIs can be normal and prostate cancer can still be present.  A prostate biopsy could be performed for further evaluation or the PSA can continue to be monitored.  If he desires to continue monitoring recommend 55-month follow-up with a PSA prior

## 2022-09-07 NOTE — Telephone Encounter (Signed)
Notified patient as instructed, Patient would like to see [place] in six months. Made lab and office visit.

## 2023-02-08 ENCOUNTER — Other Ambulatory Visit: Payer: Self-pay

## 2023-02-08 ENCOUNTER — Emergency Department: Payer: Medicare Other

## 2023-02-08 ENCOUNTER — Observation Stay
Admission: EM | Admit: 2023-02-08 | Discharge: 2023-02-09 | Disposition: A | Discharge status: Home / Self Care | Payer: Medicare Other | Attending: Internal Medicine | Admitting: Internal Medicine

## 2023-02-08 DIAGNOSIS — Z86718 Personal history of other venous thrombosis and embolism: Secondary | ICD-10-CM | POA: Insufficient documentation

## 2023-02-08 DIAGNOSIS — R066 Hiccough: Secondary | ICD-10-CM | POA: Insufficient documentation

## 2023-02-08 DIAGNOSIS — Z720 Tobacco use: Secondary | ICD-10-CM

## 2023-02-08 DIAGNOSIS — R6510 Systemic inflammatory response syndrome (SIRS) of non-infectious origin without acute organ dysfunction: Secondary | ICD-10-CM | POA: Insufficient documentation

## 2023-02-08 DIAGNOSIS — Z87891 Personal history of nicotine dependence: Secondary | ICD-10-CM | POA: Insufficient documentation

## 2023-02-08 DIAGNOSIS — J449 Chronic obstructive pulmonary disease, unspecified: Secondary | ICD-10-CM | POA: Diagnosis present

## 2023-02-08 DIAGNOSIS — U071 COVID-19: Principal | ICD-10-CM | POA: Insufficient documentation

## 2023-02-08 DIAGNOSIS — R0602 Shortness of breath: Secondary | ICD-10-CM | POA: Diagnosis present

## 2023-02-08 DIAGNOSIS — I480 Paroxysmal atrial fibrillation: Secondary | ICD-10-CM | POA: Diagnosis not present

## 2023-02-08 DIAGNOSIS — J441 Chronic obstructive pulmonary disease with (acute) exacerbation: Secondary | ICD-10-CM | POA: Insufficient documentation

## 2023-02-08 DIAGNOSIS — E785 Hyperlipidemia, unspecified: Secondary | ICD-10-CM | POA: Diagnosis not present

## 2023-02-08 DIAGNOSIS — N182 Chronic kidney disease, stage 2 (mild): Secondary | ICD-10-CM | POA: Insufficient documentation

## 2023-02-08 LAB — RESP PANEL BY RT-PCR (RSV, FLU A&B, COVID)  RVPGX2
Influenza A by PCR: NEGATIVE
Influenza B by PCR: NEGATIVE
Resp Syncytial Virus by PCR: NEGATIVE
SARS Coronavirus 2 by RT PCR: POSITIVE — AB

## 2023-02-08 LAB — CBC WITH DIFFERENTIAL/PLATELET
Abs Immature Granulocytes: 0.03 10*3/uL (ref 0.00–0.07)
Basophils Absolute: 0 10*3/uL (ref 0.0–0.1)
Basophils Relative: 1 %
Eosinophils Absolute: 0 10*3/uL (ref 0.0–0.5)
Eosinophils Relative: 1 %
HCT: 41.1 % (ref 39.0–52.0)
Hemoglobin: 13.7 g/dL (ref 13.0–17.0)
Immature Granulocytes: 0 %
Lymphocytes Relative: 11 %
Lymphs Abs: 0.7 10*3/uL (ref 0.7–4.0)
MCH: 31 pg (ref 26.0–34.0)
MCHC: 33.3 g/dL (ref 30.0–36.0)
MCV: 93 fL (ref 80.0–100.0)
Monocytes Absolute: 0.8 10*3/uL (ref 0.1–1.0)
Monocytes Relative: 12 %
Neutro Abs: 5.1 10*3/uL (ref 1.7–7.7)
Neutrophils Relative %: 75 %
Platelets: 180 10*3/uL (ref 150–400)
RBC: 4.42 MIL/uL (ref 4.22–5.81)
RDW: 13.3 % (ref 11.5–15.5)
WBC: 6.7 10*3/uL (ref 4.0–10.5)
nRBC: 0 % (ref 0.0–0.2)

## 2023-02-08 LAB — LIPASE, BLOOD: Lipase: 36 U/L (ref 11–51)

## 2023-02-08 LAB — LACTIC ACID, PLASMA: Lactic Acid, Venous: 1.4 mmol/L (ref 0.5–1.9)

## 2023-02-08 LAB — COMPREHENSIVE METABOLIC PANEL
ALT: 29 U/L (ref 0–44)
AST: 31 U/L (ref 15–41)
Albumin: 4.1 g/dL (ref 3.5–5.0)
Alkaline Phosphatase: 105 U/L (ref 38–126)
Anion gap: 11 (ref 5–15)
BUN: 19 mg/dL (ref 8–23)
CO2: 26 mmol/L (ref 22–32)
Calcium: 9.4 mg/dL (ref 8.9–10.3)
Chloride: 101 mmol/L (ref 98–111)
Creatinine, Ser: 1.29 mg/dL — ABNORMAL HIGH (ref 0.61–1.24)
GFR, Estimated: 58 mL/min — ABNORMAL LOW (ref >60)
Glucose, Bld: 106 mg/dL — ABNORMAL HIGH (ref 70–99)
Potassium: 3.9 mmol/L (ref 3.5–5.1)
Sodium: 138 mmol/L (ref 135–145)
Total Bilirubin: 0.7 mg/dL (ref 0.0–1.2)
Total Protein: 7.2 g/dL (ref 6.5–8.1)

## 2023-02-08 LAB — PROCALCITONIN: Procalcitonin: <0.1 ng/mL

## 2023-02-08 LAB — ETHANOL: Alcohol, Ethyl (B): <10 mg/dL (ref <10)

## 2023-02-08 MED ORDER — NICOTINE 21 MG/24HR TD PT24
21.0000 mg | MEDICATED_PATCH | Freq: Every day | TRANSDERMAL | Status: DC
Start: 2023-02-08 — End: 2023-02-09
  Administered 2023-02-08 – 2023-02-09 (×2): 21 mg via TRANSDERMAL
  Filled 2023-02-08 (×2): qty 1

## 2023-02-08 MED ORDER — ACETAMINOPHEN 500 MG PO TABS
1000.0000 mg | ORAL_TABLET | Freq: Once | ORAL | Status: AC
Start: 2023-02-08 — End: 2023-02-08
  Administered 2023-02-08: 1000 mg via ORAL
  Filled 2023-02-08: qty 2

## 2023-02-08 MED ORDER — IPRATROPIUM-ALBUTEROL 0.5-2.5 (3) MG/3ML IN SOLN
3.0000 mL | Freq: Four times a day (QID) | RESPIRATORY_TRACT | Status: DC
  Administered 2023-02-08 – 2023-02-09 (×5): 3 mL via RESPIRATORY_TRACT
  Filled 2023-02-08 (×5): qty 3

## 2023-02-08 MED ORDER — BUDESONIDE 0.5 MG/2ML IN SUSP
2.0000 mg | Freq: Two times a day (BID) | RESPIRATORY_TRACT | Status: DC
  Administered 2023-02-08 – 2023-02-09 (×3): 2 mg via RESPIRATORY_TRACT
  Filled 2023-02-08 (×3): qty 8

## 2023-02-08 MED ORDER — ACETAMINOPHEN 650 MG RE SUPP: 650.0000 mg | Freq: Four times a day (QID) | RECTAL | Status: DC | PRN

## 2023-02-08 MED ORDER — ALBUTEROL SULFATE (2.5 MG/3ML) 0.083% IN NEBU: 2.5000 mg | INHALATION_SOLUTION | RESPIRATORY_TRACT | Status: DC | PRN

## 2023-02-08 MED ORDER — ATORVASTATIN CALCIUM 20 MG PO TABS: 40.0000 mg | ORAL_TABLET | Freq: Every day | ORAL | Status: DC

## 2023-02-08 MED ORDER — SODIUM CHLORIDE 0.9 % IV BOLUS (SEPSIS)
1000.0000 mL | Freq: Once | INTRAVENOUS | Status: AC
  Administered 2023-02-08: 1000 mL via INTRAVENOUS

## 2023-02-08 MED ORDER — METHYLPREDNISOLONE SODIUM SUCC 125 MG IJ SOLR
125.0000 mg | Freq: Once | INTRAMUSCULAR | Status: AC
  Administered 2023-02-08: 125 mg via INTRAVENOUS
  Filled 2023-02-08: qty 2

## 2023-02-08 MED ORDER — ASPIRIN 81 MG PO TBEC
81.0000 mg | DELAYED_RELEASE_TABLET | Freq: Every day | ORAL | Status: DC
  Administered 2023-02-08 – 2023-02-09 (×2): 81 mg via ORAL
  Filled 2023-02-08 (×2): qty 1

## 2023-02-08 MED ORDER — CHLORPROMAZINE HCL 10 MG PO TABS
10.0000 mg | ORAL_TABLET | Freq: Once | ORAL | Status: AC
  Administered 2023-02-08: 10 mg via ORAL
  Filled 2023-02-08: qty 1

## 2023-02-08 MED ORDER — FLUTICASONE-SALMETEROL 45-21 MCG/ACT IN AERO: 2.0000 | INHALATION_SPRAY | Freq: Two times a day (BID) | RESPIRATORY_TRACT | 12 refills | Status: AC

## 2023-02-08 MED ORDER — ONDANSETRON HCL 4 MG/2ML IJ SOLN
4.0000 mg | Freq: Once | INTRAMUSCULAR | Status: AC
  Administered 2023-02-08: 4 mg via INTRAVENOUS
  Filled 2023-02-08: qty 2

## 2023-02-08 MED ORDER — IPRATROPIUM-ALBUTEROL 0.5-2.5 (3) MG/3ML IN SOLN
3.0000 mL | Freq: Once | RESPIRATORY_TRACT | Status: AC
  Administered 2023-02-08: 3 mL via RESPIRATORY_TRACT
  Filled 2023-02-08: qty 3

## 2023-02-08 MED ORDER — PANTOPRAZOLE SODIUM 40 MG IV SOLR
40.0000 mg | Freq: Once | INTRAVENOUS | Status: AC
Start: 2023-02-08 — End: 2023-02-08
  Administered 2023-02-08: 40 mg via INTRAVENOUS
  Filled 2023-02-08: qty 10

## 2023-02-08 MED ORDER — ENOXAPARIN SODIUM 40 MG/0.4ML IJ SOSY
40.0000 mg | PREFILLED_SYRINGE | INTRAMUSCULAR | Status: DC
  Administered 2023-02-08: 40 mg via SUBCUTANEOUS
  Filled 2023-02-08: qty 0.4

## 2023-02-08 MED ORDER — ONDANSETRON HCL 4 MG/2ML IJ SOLN: 4.0000 mg | Freq: Four times a day (QID) | INTRAMUSCULAR | Status: DC | PRN

## 2023-02-08 MED ORDER — PREDNISONE 20 MG PO TABS
40.0000 mg | ORAL_TABLET | Freq: Every day | ORAL | Status: DC
  Administered 2023-02-08 – 2023-02-09 (×2): 40 mg via ORAL
  Filled 2023-02-08 (×2): qty 2

## 2023-02-08 MED ORDER — NIRMATRELVIR/RITONAVIR (PAXLOVID) TABLET (RENAL DOSING)
2.0000 | ORAL_TABLET | Freq: Two times a day (BID) | ORAL | Status: DC
  Administered 2023-02-08 – 2023-02-09 (×3): 2 via ORAL
  Filled 2023-02-08: qty 20

## 2023-02-08 MED ORDER — ALBUTEROL SULFATE HFA 108 (90 BASE) MCG/ACT IN AERS: 2.0000 | INHALATION_SPRAY | Freq: Four times a day (QID) | RESPIRATORY_TRACT | 2 refills | Status: AC | PRN

## 2023-02-08 MED ORDER — ACETAMINOPHEN 325 MG PO TABS: 650.0000 mg | ORAL_TABLET | Freq: Four times a day (QID) | ORAL | Status: DC | PRN

## 2023-02-08 MED ORDER — ONDANSETRON HCL 4 MG PO TABS
4.0000 mg | ORAL_TABLET | Freq: Four times a day (QID) | ORAL | Status: DC | PRN
Start: 2023-02-08 — End: 2023-02-09

## 2023-02-08 NOTE — ED Provider Notes (Signed)
 [facility] Provider Note    Event Date/Time   First MD Initiated Contact with Patient 02/08/23 [phone]     (approximate)   History   No chief complaint on file.   HPI  Peter Galvan is a 78 y.o. 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  Reports fevers, chills, cough, congestion.  Denies chest pain or shortness of breath.  He is wheezing here.  Denies known history of asthma, COPD.  Reports he has had vomiting but no diarrhea, urinary symptoms.  No complaints of abdominal pain.  States he has had hiccups for 26 hours that he has not been able to get rid of.  Reports he no longer drinks alcohol.   History provided by patient.    Past Medical History:  Diagnosis Date   Alcohol abuse    a. reported seizure-like activity    Tobacco abuse     History reviewed. No pertinent surgical history.  MEDICATIONS:  Prior to Admission medications   Medication Sig Start Date End Date Taking? Authorizing Provider  rivaroxaban  (XARELTO ) 20 MG TABS tablet Take 1 tablet (20 mg total) by mouth daily with supper. Start after you finish the starter pack 05/01/20   Gonfa, Taye T, MD  RIVAROXABAN  (XARELTO ) VTE STARTER PACK (15 & 20 MG) Follow package directions: Take one 15mg  tablet by mouth twice a day. On day 22, switch to one 20mg  tablet once a day. Take with food. 03/31/20   Kathrin Mignon DASEN, MD    Physical Exam   Triage Vital Signs: ED Triage Vitals  Encounter Vitals Group     BP 02/08/23 0254 (!) 147/75     Systolic BP Percentile --      Diastolic BP Percentile --      Pulse Rate 02/08/23 0254 (!) 107     Resp 02/08/23 0254 19     Temp 02/08/23 0254 99.9 F (37.7 C)     Temp Source 02/08/23 0254 Oral     SpO2 02/08/23 0254 100 %     Weight 02/08/23 0253 125 lb (56.7 kg)     Height 02/08/23 0253 5' 9  (1.753 m)     Head Circumference --      Peak Flow --      Pain Score 02/08/23 0253 0     Pain Loc --      Pain Education --      Exclude from Growth Chart --     Most recent vital signs: Vitals:   02/08/23 0254  BP: (!) 147/75  Pulse: (!) 107  Resp: 19  Temp: 99.9 F (37.7 C)  SpO2: 100%    CONSTITUTIONAL: Alert, responds appropriately to questions.  Elderly, chronically ill-appearing, hard of hearing, occasional hiccups HEAD: Normocephalic, atraumatic EYES: Conjunctivae clear, pupils appear equal, sclera nonicteric ENT: normal nose; moist mucous membranes NECK: Supple, normal ROM CARD: Regular and tachycardic; S1 and S2 appreciated RESP: Normal chest excursion without splinting or tachypnea; patient has diffuse inspiratory and expiratory wheezing.  No rhonchi or rales.  No hypoxia or respiratory distress, speaking full sentences ABD/GI: Non-distended; soft, non-tender, no rebound, no guarding, no peritoneal signs BACK: The back appears normal EXT: Normal ROM in all joints; no deformity noted, no edema, no calf tenderness or calf swelling SKIN: Normal color for age and race; warm; no rash on exposed skin NEURO:  Moves all extremities equally, normal speech PSYCH: The patient's mood and manner are appropriate.   ED Results / Procedures / Treatments   LABS: (all labs ordered are listed, but only abnormal results are displayed) Labs Reviewed  RESP PANEL BY RT-PCR (RSV, FLU A&B, COVID)  RVPGX2 - Abnormal; Notable for the following components:      Result Value   SARS Coronavirus 2 by RT PCR POSITIVE (*)    All other components within normal limits  COMPREHENSIVE METABOLIC PANEL - Abnormal; Notable for the following components:   Glucose, Bld 106 (*)    Creatinine, Ser 1.29 (*)    GFR, Estimated 58 (*)    All other components within normal limits  CULTURE, BLOOD (SINGLE)  CBC WITH DIFFERENTIAL/PLATELET  LIPASE, BLOOD  ETHANOL  LACTIC ACID, PLASMA  URINALYSIS, ROUTINE  W REFLEX MICROSCOPIC  PROCALCITONIN     EKG:  EKG Interpretation Date/Time:  Monday February 08 2023 02:59:15 EST Ventricular Rate:  107 PR Interval:  136 QRS Duration:  86 QT Interval:  314 QTC Calculation: 419 R Axis:   62  Text Interpretation: Sinus tachycardia with occasional Premature ventricular complexes Cannot rule out Anterior infarct , age undetermined Abnormal ECG  Artifact  Confirmed by Neomi Neptune [phone]) on 02/08/2023 3:25:59 AM         RADIOLOGY: My personal review and interpretation of imaging: Chest x-ray shows no pneumonia.  I have personally reviewed all radiology reports.   DG Chest 2 View Result Date: 02/08/2023 CLINICAL DATA:  78 year old male with fever, chest congestion and body ache. EXAM: CHEST - 2 VIEW COMPARISON:  CTA chest 07/02/2022 and earlier. FINDINGS: Seated AP and lateral views at 0348 hours. Centrilobular upper lobe emphysema by CTA last year. Lower lung volumes now. Mediastinal contours are stable, within normal limits. Mild crowding of chronic increased pulmonary interstitial markings in the lower lungs now. No pneumothorax, pulmonary edema, pleural effusion or confluent opacity. Visualized tracheal air column is within normal limits. No acute osseous abnormality identified. Negative visible bowel gas. IMPRESSION: Emphysema ([id number]) with lower lung volumes. No acute cardiopulmonary abnormality identified. Electronically Signed   By: VEAR Hurst M.D.   On: 02/08/2023 04:20     PROCEDURES:  Critical Care performed: No     .1-3 Lead EKG Interpretation  Performed by: Chanice Brenton, Neptune SAILOR, DO Authorized by: Linda Grimmer, Neptune SAILOR, DO     Interpretation: abnormal     ECG rate:  107   ECG rate assessment: tachycardic     Rhythm: sinus tachycardia     Ectopy: none     Conduction: normal       IMPRESSION / MDM / ASSESSMENT AND PLAN / ED COURSE  I reviewed the triage vital signs and the nursing notes.    Patient here with flulike  symptoms.  He is wheezing here.  The patient is on the cardiac monitor to evaluate for evidence of arrhythmia and/or significant heart rate changes.   DIFFERENTIAL DIAGNOSIS (includes but not limited to):   Influenza, other viral URI, pneumonia, COPD exacerbation, bronchitis, sepsis   Patient's presentation is most consistent with acute presentation with potential threat to life or bodily function.   PLAN: Will obtain labs, lactic, culture, COVID and flu swab, chest x-ray.  Will give Tylenol , breathing treatment, Solu-Medrol .  Will give Thorazine  and Protonix  to help with hiccups.   MEDICATIONS GIVEN IN ED: Medications  ipratropium-albuterol  (DUONEB) 0.5-2.5 (3) MG/3ML nebulizer solution 3 mL (has no administration in time range)  acetaminophen  (TYLENOL )  tablet 1,000 mg (1,000 mg Oral Given 02/08/23 0408)  sodium chloride  0.9 % bolus 1,000 mL (0 mLs Intravenous Stopped 02/08/23 0457)  ondansetron  (ZOFRAN ) injection 4 mg (4 mg Intravenous Given 02/08/23 0409)  pantoprazole  (PROTONIX ) injection 40 mg (40 mg Intravenous Given 02/08/23 0409)  chlorproMAZINE  (THORAZINE ) tablet 10 mg (10 mg Oral Given 02/08/23 0409)  ipratropium-albuterol  (DUONEB) 0.5-2.5 (3) MG/3ML nebulizer solution 3 mL (3 mLs Nebulization Given 02/08/23 0500)  methylPREDNISolone  sodium succinate (SOLU-MEDROL ) 125 mg/2 mL injection 125 mg (125 mg Intravenous Given 02/08/23 0500)     ED COURSE: Labs show normal white blood cell count, normal electrolytes.  Stable chronic kidney disease.  Lactic normal.  Positive for COVID.  Chest x-ray reviewed and interpreted by myself and the radiologist and shows emphysematous changes without pneumonia.  Patient still wheezing after breathing treatments.  Will admit for COVID, COPD exacerbation.   CONSULTS:  Consulted and discussed patient's case with hospitalist, Dr. Lawence.  I have recommended admission and consulting physician agrees and will place admission orders.  Patient (and family if  present) agree with this plan.   I reviewed all nursing notes, vitals, pertinent previous records.  All labs, EKGs, imaging ordered have been independently reviewed and interpreted by myself.    OUTSIDE RECORDS REVIEWED: Reviewed last internal medicine note on 07/22/2022.       FINAL CLINICAL IMPRESSION(S) / ED DIAGNOSES   Final diagnoses:  Intractable hiccups  COVID-19  COPD with acute exacerbation (HCC)     Rx / DC Orders   ED Discharge Orders     None        Note:  This document was prepared using Dragon voice recognition software and may include unintentional dictation errors.   Kassie Keng, Josette SAILOR, DO 02/08/23 [phone]

## 2023-02-08 NOTE — ED Notes (Signed)
 Pt contact made and myself introduced. Pt is breathing normally and appears to be relaxed at this time.

## 2023-02-08 NOTE — ED Triage Notes (Signed)
 Pt reports I think I got the flu. He reports hiccups x 24 hrs, chest congestion, body aches I feel like someone hit me with a post and reports cold chills up in my chest but denies actual chest pain. He reports he started feeling sick 2 days ago and is getting worse.

## 2023-02-08 NOTE — ED Notes (Signed)
 Assumed patient care at approximately 0707 and received report from the previous nurse.

## 2023-02-08 NOTE — H&P (Addendum)
 History and Physical    Peter Galvan FMW:[id number] DOB: 1945-03-29 DOA: 02/08/2023  PCP: Glover Lenis, MD (Confirm with patient/family/NH records and if not entered, this has to be entered at [facility] point of entry) Patient coming from: Home  I have personally briefly reviewed patient's old medical records in [facility]  Chief Complaint: Feeling better  HPI: Peter Galvan is a 78 y.o. male with medical history significant of COPD, cigarette smoking, A-fib paroxysmal off anticoagulation, HLD coming with multiple complaints including feeling malaise, cycles of feeling hot and chilled, dry cough, wheezing, shortness of breath  Symptoms started 2 days ago, he is noted with URI-like symptoms with congestions sore throat, soon started to develop dry cough and episode of subjective fever and chills, no nauseous vomiting no diarrhea no loss of taste.  ED Course: Temperature 99.9, tachycardia blood pressure slightly elevated nonhypoxic.  Checks her x-ray showed emphysema like changes but no acute infiltrates.  Blood work showed WBC 6.7, creatinine 1.2, bicarb 26, COVID came back positive.  Patient was given multiple rounds of IV Solu-Medrol , DuoNebs continued to have wheezing and shortness of breath.  Review of Systems: As per HPI otherwise 14 point review of systems negative.    Past Medical History:  Diagnosis Date   Alcohol abuse    a. reported seizure-like activity    Tobacco abuse     History reviewed. No pertinent surgical history.   reports that he quit smoking about 2 years ago. His smoking use included cigarettes. He started smoking about 52 years ago. He has a 50 pack-year smoking history. He has never used smokeless tobacco. He reports that he does not currently use alcohol. He reports current drug use. Drug: Marijuana.  Allergies  Allergen Reactions   Codeine Itching and Nausea And Vomiting   Penicillins Hives    Did it involve swelling of the  face/tongue/throat, SOB, or low BP? No Did it involve sudden or severe rash/hives, skin peeling, or any reaction on the inside of your mouth or nose? No Did you need to seek medical attention at a hospital or doctor's office? No When did it last happen?      childhood If all above answers are "NO", may proceed with cephalosporin use.    Family History  Problem Relation Age of Onset   Hypertension Father    Heart disease Son        a. s/p heart transplant      Prior to Admission medications   Medication Sig Start Date End Date Taking? Authorizing Provider  atorvastatin  (LIPITOR) 40 MG tablet Take 40 mg by mouth daily. 12/02/22  Yes [provider]  rivaroxaban  (XARELTO ) 20 MG TABS tablet Take 1 tablet (20 mg total) by mouth daily with supper. Start after you finish the starter pack Patient not taking: Reported on 02/08/2023 05/01/20   Gonfa, Taye T, MD  RIVAROXABAN  (XARELTO ) VTE STARTER PACK (15 & 20 MG) Follow package directions: Take one 15mg  tablet by mouth twice a day. On day 22, switch to one 20mg  tablet once a day. Take with food. Patient not taking: Reported on 02/08/2023 03/31/20   Kathrin Mignon DASEN, MD    Physical Exam: Vitals:   02/08/23 0253 02/08/23 0254 02/08/23 0735  BP:  (!) 147/75   Pulse:  (!) 107   Resp:  19   Temp:  99.9 F (37.7 C) 98.3 F (36.8 C)  TempSrc:  Oral Oral  SpO2:  100%   Weight: 56.7 kg  Height: 5' 9 (1.753 m)      Constitutional: NAD, calm, comfortable Vitals:   02/08/23 0253 02/08/23 0254 02/08/23 0735  BP:  (!) 147/75   Pulse:  (!) 107   Resp:  19   Temp:  99.9 F (37.7 C) 98.3 F (36.8 C)  TempSrc:  Oral Oral  SpO2:  100%   Weight: 56.7 kg    Height: 5' 9 (1.753 m)     Eyes: PERRL, lids and conjunctivae normal ENMT: Mucous membranes are moist. Posterior pharynx clear of any exudate or lesions.Normal dentition.  Neck: normal, supple, no masses, no thyromegaly Respiratory: clear to auscultation bilaterally, scattered  wheezing, scattered crackles, increasing breathing effort, No accessory muscle use.  Cardiovascular: Regular rate and rhythm, no murmurs / rubs / gallops. No extremity edema. 2+ pedal pulses. No carotid bruits.  Abdomen: no tenderness, no masses palpated. No hepatosplenomegaly. Bowel sounds positive.  Musculoskeletal: no clubbing / cyanosis. No joint deformity upper and lower extremities. Good ROM, no contractures. Normal muscle tone.  Skin: no rashes, lesions, ulcers. No induration Neurologic: CN 2-12 grossly intact. Sensation intact, DTR normal. Strength 5/5 in all 4.  Psychiatric: Normal judgment and insight. Alert and oriented x 3. Normal mood.     Labs on Admission: I have personally reviewed following labs and imaging studies  CBC: Recent Labs  Lab 02/08/23 0357  WBC 6.7  NEUTROABS 5.1  HGB 13.7  HCT 41.1  MCV 93.0  PLT 180   Basic Metabolic Panel: Recent Labs  Lab 02/08/23 0357  NA 138  K 3.9  CL 101  CO2 26  GLUCOSE 106*  BUN 19  CREATININE 1.29*  CALCIUM  9.4   GFR: Estimated Creatinine Clearance: 39.7 mL/min (A) (by C-G formula based on SCr of 1.29 mg/dL (H)). Liver Function Tests: Recent Labs  Lab 02/08/23 0357  AST 31  ALT 29  ALKPHOS 105  BILITOT 0.7  PROT 7.2  ALBUMIN 4.1   Recent Labs  Lab 02/08/23 0357  LIPASE 36   No results for input(s): AMMONIA in the last 168 hours. Coagulation Profile: No results for input(s): INR, PROTIME in the last 168 hours. Cardiac Enzymes: No results for input(s): CKTOTAL, CKMB, CKMBINDEX, TROPONINI in the last 168 hours. BNP (last 3 results) No results for input(s): PROBNP in the last 8760 hours. HbA1C: No results for input(s): HGBA1C in the last 72 hours. CBG: No results for input(s): GLUCAP in the last 168 hours. Lipid Profile: No results for input(s): CHOL, HDL, LDLCALC, TRIG, CHOLHDL, LDLDIRECT in the last 72 hours. Thyroid Function Tests: No results for input(s): TSH,  T4TOTAL, FREET4, T3FREE, THYROIDAB in the last 72 hours. Anemia Panel: No results for input(s): VITAMINB12, FOLATE, FERRITIN, TIBC, IRON, RETICCTPCT in the last 72 hours. Urine analysis:    Component Value Date/Time   COLORURINE YELLOW (A) 05/18/2018 1422   APPEARANCEUR CLEAR (A) 05/18/2018 1422   LABSPEC 1.013 05/18/2018 1422   PHURINE 7.0 05/18/2018 1422   GLUCOSEU NEGATIVE 05/18/2018 1422   HGBUR NEGATIVE 05/18/2018 1422   BILIRUBINUR NEGATIVE 05/18/2018 1422   KETONESUR NEGATIVE 05/18/2018 1422   PROTEINUR NEGATIVE 05/18/2018 1422   NITRITE NEGATIVE 05/18/2018 1422   LEUKOCYTESUR NEGATIVE 05/18/2018 1422    Radiological Exams on Admission: DG Chest 2 View Result Date: 02/08/2023 CLINICAL DATA:  78 year old male with fever, chest congestion and body ache. EXAM: CHEST - 2 VIEW COMPARISON:  CTA chest 07/02/2022 and earlier. FINDINGS: Seated AP and lateral views at 0348 hours. Centrilobular upper lobe emphysema by  CTA last year. Lower lung volumes now. Mediastinal contours are stable, within normal limits. Mild crowding of chronic increased pulmonary interstitial markings in the lower lungs now. No pneumothorax, pulmonary edema, pleural effusion or confluent opacity. Visualized tracheal air column is within normal limits. No acute osseous abnormality identified. Negative visible bowel gas. IMPRESSION: Emphysema (PRI89-G56.9) with lower lung volumes. No acute cardiopulmonary abnormality identified. Electronically Signed   By: VEAR Hurst M.D.   On: 02/08/2023 04:20    EKG: Independently reviewed.  Sinus tachycardia with frequent PVCs, no acute ST changes.  Assessment/Plan Principal Problem:   COVID-19 virus infection Active Problems:   COPD with acute exacerbation (HCC)   COPD (chronic obstructive pulmonary disease) (HCC)  (please populate well all problems here in Problem List. (For example, if patient is on BP meds at home and you resume or decide to hold them, it  is a problem that needs to be her. Same for CAD, COPD, HLD and so on)  COVID-19 infection Deconditioning -Given that his respiratory symptoms started less than 2 days ago, will initiate Paxlovid  treatment -P.o. steroid and breathing treatment -Incentive spirometry  Acute COPD exacerbation -Likely triggered by COVID infection -P.o. steroid, DuoNebs, Pulmicort  -Outpatient pulmonary follow-up for lung function test  SIRS -2/2 COVID infection, management as above. -Hold off antibiotics  Hx of PAF -Used to be on Xarelto , discontinued by his doctor several years ago  he did not feel as needed it anymore -In sinus rhythm, will start aspirin  81 mg daily  CKD stage II -Euvolemic, creatinine level stable  HLD -As recommendation from pharmacy, will hold off statin while on Paxlovid   Cigarette smoking -Cessation education performed at bedside -Nicotine  patch  DVT prophylaxis: Lovenox  Code Status: Full code Family Communication: None at bedside Disposition Plan: Expect less than 2 midnights hospital stay Consults called: None Admission status: Telemetry observation   Cort ONEIDA Mana MD [facility] Pager [phone]  02/08/2023, 9:47 AM

## 2023-02-08 NOTE — ED Notes (Signed)
 Called CCMD and added pt on monitoring board

## 2023-02-09 ENCOUNTER — Other Ambulatory Visit: Payer: Self-pay

## 2023-02-09 LAB — URINALYSIS, ROUTINE W REFLEX MICROSCOPIC
Bilirubin Urine: NEGATIVE
Glucose, UA: NEGATIVE mg/dL
Hgb urine dipstick: NEGATIVE
Ketones, ur: NEGATIVE mg/dL
Leukocytes,Ua: NEGATIVE
Nitrite: NEGATIVE
Protein, ur: NEGATIVE mg/dL
Specific Gravity, Urine: 1.006 (ref 1.005–1.030)
pH: 5 (ref 5.0–8.0)

## 2023-02-09 MED ORDER — NIRMATRELVIR/RITONAVIR (PAXLOVID) TABLET (RENAL DOSING)
2.0000 | ORAL_TABLET | Freq: Two times a day (BID) | ORAL | 0 refills | Status: AC
  Filled 2023-02-09: qty 20, 5d supply, fill #0

## 2023-02-09 MED ORDER — PREDNISONE 10 MG PO TABS
ORAL_TABLET | ORAL | 0 refills | Status: AC
  Filled 2023-02-09: qty 10, 4d supply, fill #0

## 2023-02-09 MED ORDER — FLUTICASONE-SALMETEROL 100-50 MCG/ACT IN AEPB: 1.0000 | INHALATION_SPRAY | Freq: Two times a day (BID) | RESPIRATORY_TRACT | 2 refills | Status: AC

## 2023-02-09 MED ORDER — FLUTICASONE-SALMETEROL 230-21 MCG/ACT IN AERO
2.0000 | INHALATION_SPRAY | Freq: Two times a day (BID) | RESPIRATORY_TRACT | 12 refills | Status: AC
  Filled 2023-02-09: qty 1, 1d supply, fill #0

## 2023-02-09 MED ORDER — NICOTINE 21 MG/24HR TD PT24
21.0000 mg | MEDICATED_PATCH | Freq: Every day | TRANSDERMAL | 0 refills | Status: AC
  Filled 2023-02-09: qty 28, 28d supply, fill #0

## 2023-02-09 MED ORDER — ASPIRIN 81 MG PO TBEC
81.0000 mg | DELAYED_RELEASE_TABLET | Freq: Every day | ORAL | 12 refills | Status: AC
  Filled 2023-02-09: qty 30, 30d supply, fill #0

## 2023-02-09 MED ORDER — ALBUTEROL SULFATE (2.5 MG/3ML) 0.083% IN NEBU
2.5000 mg | INHALATION_SOLUTION | RESPIRATORY_TRACT | 2 refills | Status: AC | PRN
  Filled 2023-02-09: qty 75, 5d supply, fill #0

## 2023-02-13 LAB — CULTURE, BLOOD (SINGLE)
Culture: NO GROWTH
Special Requests: ADEQUATE

## 2023-03-04 ENCOUNTER — Other Ambulatory Visit: Payer: Self-pay | Admitting: *Deleted

## 2023-03-04 DIAGNOSIS — R972 Elevated prostate specific antigen [PSA]: Secondary | ICD-10-CM

## 2023-03-10 ENCOUNTER — Other Ambulatory Visit: Payer: Medicare Other

## 2023-03-10 DIAGNOSIS — R972 Elevated prostate specific antigen [PSA]: Secondary | ICD-10-CM

## 2023-03-11 LAB — PSA: Prostate Specific Ag, Serum: 10.4 ng/mL — ABNORMAL HIGH (ref 0.0–4.0)

## 2023-03-12 ENCOUNTER — Ambulatory Visit: Payer: Self-pay | Admitting: Urology

## 2023-04-15 ENCOUNTER — Encounter: Payer: Self-pay | Admitting: Urology

## 2023-04-15 ENCOUNTER — Ambulatory Visit: Payer: Medicare Other | Admitting: Urology

## 2023-04-15 VITALS — BP 145/80 | HR 84 | Ht 69.0 in | Wt 124.0 lb

## 2023-04-15 DIAGNOSIS — R972 Elevated prostate specific antigen [PSA]: Secondary | ICD-10-CM | POA: Diagnosis not present

## 2023-04-15 NOTE — Progress Notes (Signed)
 I, Maysun Anabel Bene, acting as a scribe for Riki Altes, MD., have documented all relevant documentation on the behalf of Riki Altes, MD, as directed by Riki Altes, MD while in the presence of Riki Altes, MD.  04/15/2023 6:00 PM   Peter Galvan 27-Feb-1945 [id number]  Referring provider: Dorothey Baseman, MD [phone] [place],  [place] [address]  Chief Complaint  Patient presents with   Elevated PSA   Urologic history: 1. Elevated PSA PSA 03/2020 6.7 with repeat 5 months later 5.9  PSA 07/22/22 9.4 Prostate MRI 08/31/22 46 cc gland with no intermediate lesions or lesions suspicious of high-grade prostate cancer. Elected surveillance  HPI: Peter Galvan is a 78 y.o. male presents for a 6 month follow-up visit.    No complaints since last visit.  Denies bothersome lower urinary tract symptoms,  No dysuria or gross hematuria.  PSA 03/10/23 has increased to 10.4    PMH: Past Medical History:  Diagnosis Date   Alcohol abuse    a. reported seizure-like activity    Tobacco abuse     Home Medications:  Allergies as of 04/15/2023       Reactions   Codeine Itching, Nausea And Vomiting   Penicillins Hives   Did it involve swelling of the face/tongue/throat, SOB, or low BP? No Did it involve sudden or severe rash/hives, skin peeling, or any reaction on the inside of your mouth or nose? No Did you need to seek medical attention at a hospital or doctor's office? No When did it last happen?      childhood If all above answers are "NO", may proceed with cephalosporin use.        Medication List        Accurate as of April 15, 2023  6:00 PM. If you have any questions, ask your nurse or doctor.          albuterol 108 (90 Base) MCG/ACT inhaler Commonly known as: VENTOLIN HFA Inhale 2 puffs into the lungs every 6 (six) hours as needed for wheezing or shortness of breath.   albuterol (2.5 MG/3ML) 0.083% nebulizer solution Commonly known as:  PROVENTIL Take 3 mLs (2.5 mg total) by nebulization every 4 (four) hours as needed for wheezing or shortness of breath.   aspirin EC 81 MG tablet Take 1 tablet (81 mg total) by mouth daily. Swallow whole.   atorvastatin 40 MG tablet Commonly known as: LIPITOR Take 40 mg by mouth daily.   fluticasone-salmeterol 45-21 MCG/ACT inhaler Commonly known as: Advair HFA Inhale 2 puffs into the lungs 2 (two) times daily.   fluticasone-salmeterol 230-21 MCG/ACT inhaler Commonly known as: Advair HFA Inhale 2 puffs into the lungs 2 (two) times daily.   fluticasone-salmeterol 100-50 MCG/ACT Aepb Commonly known as: ADVAIR Inhale 1 puff into the lungs 2 (two) times daily.   nicotine 21 mg/24hr patch Commonly known as: NICODERM CQ - dosed in mg/24 hours Place 1 patch (21 mg total) onto the skin daily.        Allergies:  Allergies  Allergen Reactions   Codeine Itching and Nausea And Vomiting   Penicillins Hives    Did it involve swelling of the face/tongue/throat, SOB, or low BP? No Did it involve sudden or severe rash/hives, skin peeling, or any reaction on the inside of your mouth or nose? No Did you need to seek medical attention at a hospital or doctor's office? No When did it last happen?  childhood If all above answers are "NO", may proceed with cephalosporin use.    Family History: Family History  Problem Relation Age of Onset   Hypertension Father    Heart disease Son        a. s/p heart transplant     Social History:  reports that he quit smoking about 3 years ago. His smoking use included cigarettes. He started smoking about 53 years ago. He has a 50 pack-year smoking history. He has never used smokeless tobacco. He reports that he does not currently use alcohol. He reports current drug use. Drug: Marijuana.   Physical Exam: BP (!) 145/80   Pulse 84   Ht 5\' 9"  (1.753 m)   Wt 124 lb (56.2 kg)   BMI 18.31 kg/m   Constitutional:  Alert and oriented, No acute  distress. HEENT: [place] AT, moist mucus membranes.  Trachea midline, no masses. Cardiovascular: No clubbing, cyanosis, or edema. Respiratory: Normal respiratory effort, no increased work of breathing. GU: Patient refuses DRE  Psychiatric: Normal mood and affect.   Assessment & Plan:    1. Elevated PSA Current PSA elevated above baseline.  Recent MRI showed no lesion suspicious for high-grade prostate cancer.  We discussed the false negative rate of MRI and we discuss the option of scheduling a TRU/biopsy the prostate versus PSA surveillance. He has refused rectal exam and biopsy would need to be performed in OR under sedation.  He has elected surveillance and will schedule a lab visit 3 months with a repeat PSA; prostate biopsy for continued rise.  I have reviewed the above documentation for accuracy and completeness, and I agree with the above.   Riki Altes, MD  [facility] [address], [address] [place], [place] [address] [phone]

## 2023-06-28 ENCOUNTER — Other Ambulatory Visit: Payer: Self-pay

## 2023-06-28 ENCOUNTER — Encounter: Payer: Self-pay | Admitting: Acute Care

## 2023-06-28 DIAGNOSIS — Z122 Encounter for screening for malignant neoplasm of respiratory organs: Secondary | ICD-10-CM

## 2023-06-28 DIAGNOSIS — Z87891 Personal history of nicotine dependence: Secondary | ICD-10-CM

## 2023-07-16 ENCOUNTER — Other Ambulatory Visit

## 2023-07-16 DIAGNOSIS — R972 Elevated prostate specific antigen [PSA]: Secondary | ICD-10-CM

## 2023-07-17 LAB — PSA: Prostate Specific Ag, Serum: 6.5 ng/mL — ABNORMAL HIGH (ref 0.0–4.0)

## 2023-07-18 ENCOUNTER — Ambulatory Visit: Payer: Self-pay | Admitting: Urology

## 2023-07-19 ENCOUNTER — Ambulatory Visit
Admission: RE | Admit: 2023-07-19 | Discharge: 2023-07-19 | Disposition: A | Discharge status: Home / Self Care | Source: Ambulatory Visit | Attending: Acute Care | Admitting: Acute Care

## 2023-07-19 DIAGNOSIS — Z87891 Personal history of nicotine dependence: Secondary | ICD-10-CM | POA: Diagnosis present

## 2023-07-19 DIAGNOSIS — Z122 Encounter for screening for malignant neoplasm of respiratory organs: Secondary | ICD-10-CM | POA: Insufficient documentation

## 2023-08-03 ENCOUNTER — Other Ambulatory Visit: Payer: Self-pay

## 2023-08-03 DIAGNOSIS — Z122 Encounter for screening for malignant neoplasm of respiratory organs: Secondary | ICD-10-CM

## 2023-08-03 DIAGNOSIS — Z87891 Personal history of nicotine dependence: Secondary | ICD-10-CM

## 2023-08-03 DIAGNOSIS — F17210 Nicotine dependence, cigarettes, uncomplicated: Secondary | ICD-10-CM

## 2024-01-18 ENCOUNTER — Other Ambulatory Visit

## 2024-01-18 DIAGNOSIS — Z122 Encounter for screening for malignant neoplasm of respiratory organs: Secondary | ICD-10-CM

## 2024-01-18 DIAGNOSIS — Z87891 Personal history of nicotine dependence: Secondary | ICD-10-CM

## 2024-01-18 DIAGNOSIS — F17210 Nicotine dependence, cigarettes, uncomplicated: Secondary | ICD-10-CM

## 2024-01-18 DIAGNOSIS — R972 Elevated prostate specific antigen [PSA]: Secondary | ICD-10-CM

## 2024-01-19 LAB — PSA: Prostate Specific Ag, Serum: 7.6 ng/mL — ABNORMAL HIGH (ref 0.0–4.0)

## 2024-01-25 ENCOUNTER — Ambulatory Visit: Admitting: Urology

## 2024-01-25 ENCOUNTER — Encounter: Payer: Self-pay | Admitting: Urology

## 2024-01-25 VITALS — BP 162/80 | HR 80 | Ht 66.0 in | Wt 125.0 lb

## 2024-01-25 DIAGNOSIS — R972 Elevated prostate specific antigen [PSA]: Secondary | ICD-10-CM | POA: Diagnosis not present

## 2024-01-25 NOTE — Progress Notes (Unsigned)
 "  01/25/2024 9:13 AM   Peter Galvan 11/29/1945 [id number]  Referring provider: Glover Lenis, MD [phone] [place],  [place] [address]  Chief Complaint  Patient presents with   Elevated PSA   Urologic history: 1. Elevated PSA PSA 03/2020 6.7 with repeat 5 months later 5.9  PSA 07/22/22 9.4 Prostate MRI 08/31/22 46 cc gland with no intermediate lesions or lesions suspicious of high-grade prostate cancer. Elected surveillance  HPI: Peter Galvan is a 78 y.o. male who presents for a 54-month follow-up  No complaints since last visit Stable lower urinary tract symptoms Denies dysuria, gross hematuria PSA 01/18/2024 stable at 7.6  PSA trend    Prostate Specific Ag, Serum  Latest Ref Rng 0.0 - 4.0 ng/mL                       PMH: Past Medical History:  Diagnosis Date   Alcohol abuse    a. reported seizure-like activity    Tobacco abuse     Surgical History: No past surgical history on file.  Home Medications:  Allergies as of 01/25/2024       Reactions   Codeine Itching, Nausea And Vomiting   Penicillins Hives   Did it involve swelling of the face/tongue/throat, SOB, or low BP? No Did it involve sudden or severe rash/hives, skin peeling, or any reaction on the inside of your mouth or nose? No Did you need to seek medical attention at a hospital or doctor's office? No When did it last happen?      childhood If all above answers are NO, may proceed with cephalosporin use.        Medication List        Accurate as of January 25, 2024  9:13 AM. If you have any questions, ask your nurse or doctor.          albuterol  108 (90 Base) MCG/ACT inhaler Commonly known as: VENTOLIN  HFA Inhale 2 puffs into the lungs every 6 (six) hours as needed for wheezing or shortness of breath.   albuterol  (2.5 MG/3ML) 0.083% nebulizer solution Commonly known as: PROVENTIL  Take 3 mLs (2.5 mg total) by nebulization every 4 (four) hours as needed for  wheezing or shortness of breath.   aspirin  EC 81 MG tablet Take 1 tablet (81 mg total) by mouth daily. Swallow whole.   atorvastatin  40 MG tablet Commonly known as: LIPITOR Take 40 mg by mouth daily.   fluticasone -salmeterol 45-21 MCG/ACT inhaler Commonly known as: Advair HFA Inhale 2 puffs into the lungs 2 (two) times daily.   fluticasone -salmeterol 230-21 MCG/ACT inhaler Commonly known as: Advair HFA Inhale 2 puffs into the lungs 2 (two) times daily.   fluticasone -salmeterol 100-50 MCG/ACT Aepb Commonly known as: ADVAIR Inhale 1 puff into the lungs 2 (two) times daily.   nicotine  21 mg/24hr patch Commonly known as: NICODERM CQ  - dosed in mg/24 hours Place 1 patch (21 mg total) onto the skin daily.        Allergies: Allergies[1]  Family History: Family History  Problem Relation Age of Onset   Hypertension Father    Heart disease Son        a. s/p heart transplant     Social History:  reports that he quit smoking about 3 years ago. His smoking use included cigarettes. He started smoking about 53 years ago. He has a 50 pack-year smoking history. He has never used smokeless tobacco. He reports that he does not  currently use alcohol. He reports current drug use. Drug: Marijuana.   Physical Exam: BP (!) 162/80   Pulse 80   Ht 5' 6 (1.676 m)   Wt 125 lb (56.7 kg)   BMI 20.18 kg/m   Constitutional:  Alert, No acute distress. HEENT: [place] AT Respiratory: Normal respiratory effort, no increased work of breathing. GU: Refuses DRE Psychiatric: Normal mood and affect.   Assessment & Plan:    1.  Elevated PSA Stable Will move to annual visits/PSA  ***Feeling PSA trend [name], MD  [facility] [address] [place], [place] [address] [phone]    [1]  Allergies Allergen Reactions   Codeine Itching and Nausea And Vomiting   Penicillins Hives    Did it involve swelling of the face/tongue/throat, SOB, or low BP?  No Did it involve sudden or severe rash/hives, skin peeling, or any reaction on the inside of your mouth or nose? No Did you need to seek medical attention at a hospital or doctor's office? No When did it last happen?      childhood If all above answers are NO, may proceed with cephalosporin use.   "

## 2025-01-23 ENCOUNTER — Other Ambulatory Visit

## 2025-01-24 ENCOUNTER — Ambulatory Visit: Admitting: Urology
